# Patient Record
Sex: Male | Born: 1955
Health system: Southern US, Community
[De-identification: ages and names within clinical notes are randomized; demographics above are authoritative.]

## PROBLEM LIST (undated history)

## (undated) DIAGNOSIS — E119 Type 2 diabetes mellitus without complications: Secondary | ICD-10-CM

## (undated) DIAGNOSIS — Z85828 Personal history of other malignant neoplasm of skin: Secondary | ICD-10-CM

## (undated) DIAGNOSIS — I10 Essential (primary) hypertension: Secondary | ICD-10-CM

## (undated) DIAGNOSIS — R351 Nocturia: Secondary | ICD-10-CM

## (undated) DIAGNOSIS — E669 Obesity, unspecified: Secondary | ICD-10-CM

## (undated) DIAGNOSIS — R350 Frequency of micturition: Secondary | ICD-10-CM

## (undated) DIAGNOSIS — M1990 Unspecified osteoarthritis, unspecified site: Secondary | ICD-10-CM

## (undated) DIAGNOSIS — E785 Hyperlipidemia, unspecified: Secondary | ICD-10-CM

## (undated) HISTORY — DX: Hyperlipidemia, unspecified: E78.5

## (undated) HISTORY — DX: Obesity, unspecified: E66.9

## (undated) HISTORY — DX: Type 2 diabetes mellitus without complications: E11.9

## (undated) HISTORY — PX: APPENDECTOMY: SHX54

---

## 2012-07-03 HISTORY — PX: KNEE ARTHROSCOPY: SUR90

## 2013-05-09 ENCOUNTER — Encounter: Payer: 59 | Attending: Family Medicine

## 2013-05-09 ENCOUNTER — Encounter (INDEPENDENT_AMBULATORY_CARE_PROVIDER_SITE_OTHER): Payer: Self-pay

## 2013-05-09 VITALS — Ht 72.0 in | Wt 310.4 lb

## 2013-05-09 DIAGNOSIS — E119 Type 2 diabetes mellitus without complications: Secondary | ICD-10-CM | POA: Insufficient documentation

## 2013-05-09 DIAGNOSIS — Z713 Dietary counseling and surveillance: Secondary | ICD-10-CM | POA: Insufficient documentation

## 2013-05-09 NOTE — Progress Notes (Signed)
Patient was seen on 05/09/13 for the complete diabetes self-management series at the [facility].  Current A1c = 9.0%  Handouts given during class include:  Living Well with Diabetes book  Carb Counting and Meal Planning book  Meal Plan Card  Carbohydrate guide  Meal planning worksheet  Low Sodium Flavoring Tips  The diabetes portion plate  Low Carbohydrate Snack Suggestions  A1c to eAG Conversion Chart  Diabetes Medications  Stress Management  Diabetes Recommended Care Schedule  Diabetes Success Plan  Core Class Satisfaction Survey  The following learning objectives were met by the patient during this course:  Describe diabetes  State some common risk factors for diabetes  Defines the role of glucose and insulin  Identifies type of diabetes and pathophysiology  Describe the relationship between diabetes and cardiovascular risk  State the members of the Healthcare Team  States the rationale for glucose monitoring  State when to test glucose  State their individual Target Range  State the importance of logging glucose readings  Describe how to interpret glucose readings  Identifies A1C target  Explain the correlation between A1c and eAG values  State symptoms and treatment of high blood glucose  State symptoms and treatment of low blood glucose  Explain proper technique for glucose testing  Identifies proper sharps disposal  Describe the role of different macronutrients on glucose  Explain how carbohydrates affect blood glucose  State what foods contain the most carbohydrates  Demonstrate carbohydrate counting  Demonstrate how to read Nutrition Facts food label  Describe effects of various fats on heart health  Describe the importance of good nutrition for health and healthy eating strategies  Describe techniques for managing your shopping, cooking and meal planning  List strategies to follow meal plan  when dining out  Describe the effects of alcohol on glucose and how to use it safely   State the amount of activity recommended for healthy living   Describe activities suitable for individual needs   Identify ways to regularly incorporate activity into daily life   Identify barriers to activity and ways to over come these barriers  Identify diabetes medications being personally used and their primary action for lowering glucose and possible side effects   Describe role of stress on blood glucose and develop strategies to address psychosocial issues   Identify diabetes complications and ways to prevent them  Explain how to manage diabetes during illness   Evaluate success in meeting personal goal   Establish 2-3 goals that they will plan to diligently work on until they return for the  14-monthfollow-up visit  Goals:  Follow Diabetes Meal Plan as instructed  Eat 3 meals and 2 snacks, every 3-5 hrs  Limit carbohydrate intake to 45 grams carbohydrate/meal Limit carbohydrate intake to 15 grams carbohydrate/snack Add lean protein foods to meals/snacks  Monitor glucose levels as instructed by your doctor  Aim for 15-30 mins of physical activity daily as tolerated  Bring food record and glucose log to your next nutrition visit  Your patient has established the following 4 month goals in their individualized success plan: I will count my carb choices at most meals and snacks and record my food intake overall I will increase my activity level at least 5 days a week I will test my BG 3 times a day, 7 days a week  Your patient has identified these potential barriers to change:  None stated  Your patient has identified their diabetes self-care support plan as  family

## 2013-09-07 ENCOUNTER — Encounter: Payer: 59 | Attending: Family Medicine

## 2013-09-07 DIAGNOSIS — Z713 Dietary counseling and surveillance: Secondary | ICD-10-CM | POA: Diagnosis not present

## 2013-09-07 DIAGNOSIS — E118 Type 2 diabetes mellitus with unspecified complications: Secondary | ICD-10-CM | POA: Diagnosis not present

## 2013-09-07 NOTE — Progress Notes (Signed)
Patient was seen on 09/07/2013 for a review of the series of three diabetes self-management courses at the [facility]. The following learning objectives were met by the patient during this class:    Reviewed blood glucose monitoring and interpretation including the recommended target ranges and Hgb A1c.    Reviewed on carb counting, importance of regularly scheduled meals/snacks, and meal planning.    Reviewed the effects of physical activity on glucose levels and long-term glucose control.  Recommended goal of 150 minutes of physical activity/week.   Reviewed patient medications and discussed role of medication on blood glucose and possible side effects.   Discussed strategies to manage stress, psychosocial issues, and other obstacles to diabetes management.   Encouraged moderate weight reduction to improve glucose levels.     Reviewed short-term complications: hyper- and hypo-glycemia.  Discussed causes, symptoms, and treatment options.   Reviewed prevention, detection, and treatment of long-term complications.  Discussed the role of prolonged elevated glucose levels on body systems.  Goals:  Follow Diabetes Meal Plan as instructed  Eat 3 meals and 2 snacks, every 3-5 hrs  Limit carbohydrate intake to 45 grams carbohydrate/meal Limit carbohydrate intake to 15 grams carbohydrate/snack Add lean protein foods to meals/snacks  Monitor glucose levels as instructed by your doctor  Aim for goal of 15-30 mins of physical activity daily as tolerated  Bring food record and glucose log to your next nutrition visit   

## 2013-09-09 ENCOUNTER — Ambulatory Visit (INDEPENDENT_AMBULATORY_CARE_PROVIDER_SITE_OTHER): Payer: Self-pay | Admitting: Family Medicine

## 2013-09-09 DIAGNOSIS — E119 Type 2 diabetes mellitus without complications: Secondary | ICD-10-CM

## 2013-09-10 DIAGNOSIS — E119 Type 2 diabetes mellitus without complications: Secondary | ICD-10-CM | POA: Insufficient documentation

## 2013-09-10 NOTE — Progress Notes (Signed)
Patient presents today for annual pharmacy consult as part of the employer-sponsored [employer] to [employer] program. Current diabetes regimen includes metformin, Humulin U-500, and Victoza. Patient also continues on daily ASA, ACEi, and statin. Most recent MD follow-up was with Dr. Buddy Duty in May. No med changes or major health changes at this time. Patient has a good understanding of diabetes medications and comes prepared with questions today regarding his diabetes care.  Diabetes Assessment: Type of Diabetes: Type 2; Year of diagnosis 2004; Sees Diabetes provider 4 or more times per year; checks blood glucose once daily; checks feet daily; uses glucometer Freestyle Freedom Lite; takes medications as prescribed; takes an aspirin a day; What are the signs and symptoms of hypoglycemia? Shaky Feels in the 70s; target A1c? 7.0 %; What are the signs of hyperglycemia? High blood glucose sleepy; Current Diabetes related medical conditions are High cholesterol; Diabetes Education 05/09/13 completed class at [facility]; Highest CBG 341 Ate chips; Lowest CBG 56; 14 day CBG average 197; 30 day CBG average 201; 7 day CBG average 194; hypoglycemia frequency 2-3 times in last month occurs after exercise or at night. Feels low at 53; MD managing Diabetes Dr.Ehinger, PCP and Dr. Buddy Duty, endo; Other Diabetes History: Patient was diagnosed with type 2 diabetes at age 58 (approx 10 years ago). He has recently moved here from [place] and is making efforts to gain better control of diabetes. Current diabetes med regimen includes metformin 1000 mg twice daily, Humulin U-500 10 unit markings before each meal, and Victoza 1.8 mg daily. Patient reports some mild GI upset with metformin. While in [place], patient was on Metformin 850 mg twice daily, once moving here, Dr. Buddy Duty increased to 1000 mg twice daily; however, he has not yet started the 1000 mg tablets as he is trying to finish 850 mg tabs first. I have reminded  him to take metformin with a full meal. I have also suggested switching to ER if GI upset continues once switching to 1000 mg strength. If we switch to ER pt would need to take two 500 mg ER tabs, he did not seem to want to do this given it would be 4 tablets per day vs two. Pateint has recently had reduction in insulin requirement due to increased physical activity. He is now down to 10 unit markings prior to each meal. This reduction was made by Dr. Buddy Duty after pateint had hypoglycemic episodes daily for 4 days in a row after starting exercise regimen. Patient is tolerating Victoza well and reports it is helping reduce appetite and helps him feel full faster. Patient is testing twice daily on a normal basis and 5-6 times daily if experiencing lows. When patient does have hypoglycemia, the readings have been as low as 50s, and are easily correted with glucose tablets. Paitent denies signs and symptoms of neuropathy, and is up to date on eye exam. He will follow-up with Peter Garter, RN, CCM.  Lifestyle Factors: Diet - Patient is attempting to make healthier choices and monitor portion sizes in an attempt at weight loss. He is now eating more salads and making low calorie choices. He attempts to keep carbs at 4-5 servings per meal or less. We did not go into great detail with diet as this was primarily a pharmacy/med review visit.  Exercise - Paitent has now joined [employer] and is attending three times weekly, and has been for two weeks. He uses the elliptical for 45 min and then does resistance/weight training.  He is also wakling twice weekly.   Assessment: Patient presents today with above goal a1c of 9.0, but is making appropriate changes to bring this down to goal of <7.0. He has a great understanding of medications and is having no major issues at this time. He will follow-up with CCM in September.  Plan: 1) Continue making healthy dietary choices and continue to improve portion control and carb  control 2) Continue to exercise regularly, great job with increases in this 3) Monitor regularly, especially around exericse 4) Follow-up with Peter Garter with [facility] in September

## 2013-10-29 NOTE — Progress Notes (Signed)
Patient ID: Eric Juarez, male   DOB: 1955-09-23, 58 y.o.   MRN: [id number] Reviewed: Agree with the documentation and management by my Green.

## 2014-04-01 ENCOUNTER — Other Ambulatory Visit (INDEPENDENT_AMBULATORY_CARE_PROVIDER_SITE_OTHER): Payer: Self-pay | Admitting: General Surgery

## 2014-04-01 DIAGNOSIS — E8881 Metabolic syndrome: Secondary | ICD-10-CM

## 2014-04-23 ENCOUNTER — Ambulatory Visit (HOSPITAL_COMMUNITY)
Admission: RE | Admit: 2014-04-23 | Discharge: 2014-04-23 | Disposition: A | Discharge status: Home / Self Care | Payer: 59 | Source: Ambulatory Visit | Attending: General Surgery | Admitting: General Surgery

## 2014-04-23 ENCOUNTER — Other Ambulatory Visit: Payer: Self-pay

## 2014-04-23 DIAGNOSIS — Z01818 Encounter for other preprocedural examination: Secondary | ICD-10-CM | POA: Diagnosis not present

## 2014-04-23 DIAGNOSIS — E8881 Metabolic syndrome: Secondary | ICD-10-CM

## 2014-05-03 ENCOUNTER — Encounter: Payer: Self-pay | Admitting: Dietician

## 2014-05-03 ENCOUNTER — Encounter: Payer: 59 | Attending: General Surgery | Admitting: Dietician

## 2014-05-03 DIAGNOSIS — Z713 Dietary counseling and surveillance: Secondary | ICD-10-CM | POA: Diagnosis not present

## 2014-05-03 DIAGNOSIS — Z6841 Body Mass Index (BMI) 40.0 and over, adult: Secondary | ICD-10-CM | POA: Insufficient documentation

## 2014-05-03 NOTE — Patient Instructions (Signed)

## 2014-05-03 NOTE — Progress Notes (Signed)
  Pre-Op Assessment Visit:  Pre-Operative RYGB Surgery  Medical Nutrition Therapy:  Appt start time: 1000   End time:  1100.  Patient was seen on 05/03/2014 for Pre-Operative Nutrition Assessment. Assessment and letter of approval faxed to [facility] Surgery Bariatric Surgery Program coordinator on 05/03/2014.   Preferred Learning Style:   No preference indicated   Learning Readiness:   Ready  Handouts given during visit include:  Pre-Op Goals Bariatric Surgery Protein Shakes   During the appointment today the following Pre-Op Goals were reviewed with the patient: Maintain or lose weight as instructed by your surgeon Make healthy food choices Begin to limit portion sizes Limited concentrated sugars and fried foods Keep fat/sugar in the single digits per serving on   food labels Practice CHEWING your food  (aim for 30 chews per bite or until applesauce consistency) Practice not drinking 15 minutes before, during, and 30 minutes after each meal/snack Avoid all carbonated beverages  Avoid/limit caffeinated beverages  Avoid all sugar-sweetened beverages Consume 3 meals per day; eat every 3-5 hours Make a list of non-food related activities Aim for 64-100 ounces of FLUID daily  Aim for at least 60-80 grams of PROTEIN daily Look for a liquid protein source that contain ?15 g protein and ?5 g carbohydrate  (ex: shakes, drinks, shots)  Patient-Centered Goals: Eric Juarez would like to control his diabetes better (high and low blood sugar). Eric Juarez feels like he will be able follow the pre op goal feels like they are very important.    Demonstrated degree of understanding via:  Teach Back  Teaching Method Utilized:  Visual Auditory Hands on  Barriers to learning/adherence to lifestyle change: none  Patient to call the [facility] to enroll in Pre-Op and Post-Op Nutrition Education when surgery date is scheduled.

## 2014-05-27 ENCOUNTER — Ambulatory Visit: Payer: Self-pay | Admitting: General Surgery

## 2014-05-31 ENCOUNTER — Encounter: Payer: 59 | Attending: General Surgery

## 2014-05-31 DIAGNOSIS — Z6841 Body Mass Index (BMI) 40.0 and over, adult: Secondary | ICD-10-CM | POA: Insufficient documentation

## 2014-05-31 DIAGNOSIS — Z713 Dietary counseling and surveillance: Secondary | ICD-10-CM | POA: Diagnosis not present

## 2014-06-01 ENCOUNTER — Other Ambulatory Visit: Payer: Self-pay | Admitting: General Surgery

## 2014-06-01 NOTE — Progress Notes (Signed)
  Pre-Operative Nutrition Class:  Appt start time: 6378   End time:  1830.  Patient was seen on 05/31/14 for Pre-Operative Bariatric Surgery Education at the [facility].   Surgery date:   Surgery type: RYGB Start weight at [facility]: 308 lbs on 05/03/14 Weight today: 306 lbs  TANITA  BODY COMP RESULTS  05/31/14   BMI (kg/m^2) 41.5   Fat Mass (lbs) 122   Fat Free Mass (lbs) 184   Total Body Water (lbs) 134.5   Samples given per MNT protocol. Patient educated on appropriate usage: Unjury protein powder (strawberry - qty 1) Lot #: [id number] Exp: 04/2015  Premier protein shake (chocolate - qty 1) Lot #: [id number] Exp: 01/2015  PB2 (qty 1) Lot #: [id number] Exp: 10/2014  Bariatric Advantage calcium citrate chew (orange - qty 1) Lot #: [id number] Exp: 07/2014  The following the learning objectives were met by the patient during this course:  Identify Pre-Op Dietary Goals and will begin 2 weeks pre-operatively  Identify appropriate sources of fluids and proteins   State protein recommendations and appropriate sources pre and post-operatively  Identify Post-Operative Dietary Goals and will follow for 2 weeks post-operatively  Identify appropriate multivitamin and calcium sources  Describe the need for physical activity post-operatively and will follow MD recommendations  State when to call healthcare provider regarding medication questions or post-operative complications  Handouts given during class include:  Pre-Op Bariatric Surgery Diet Handout  Protein Shake Handout  Post-Op Bariatric Surgery Nutrition Handout  BELT Program Information Flyer  Support Group Information Flyer  [facility] Bariatric Supplements Price List  Follow-Up Plan: Patient will follow-up at [facility] 2 weeks post operatively for diet advancement per MD.

## 2014-06-02 ENCOUNTER — Other Ambulatory Visit: Payer: Self-pay

## 2014-06-02 ENCOUNTER — Other Ambulatory Visit: Payer: Self-pay | Admitting: General Surgery

## 2014-06-02 ENCOUNTER — Ambulatory Visit: Payer: Self-pay | Admitting: General Surgery

## 2014-06-02 NOTE — Patient Outreach (Signed)
[facility]) Care Management  06/02/2014  Jahn Franchini Jan 25, 1956 [id number]   Member called to reschedule appointment for 06/15/14.  States he is scheduled for bariatric surgery on 06/14/14. Appointment rescheduled for 07/06/14.  Peter Garter RN, [facility] Care Management [phone]

## 2014-06-02 NOTE — Patient Instructions (Addendum)
Eric Juarez  06/02/2014   Your procedure is scheduled on: 06/14/14   Report to [facility] Main  Entrance and follow signs to               [place] at 5:15 AM.   Call this number if you have problems the morning of surgery [phone]   Remember:  Do not eat food or drink liquids :After Midnight.    Take these medicines the morning of surgery with A SIP OF WATER: NONE             STOP ASPIRIN / IBUPROFEN / ALEVE / HERBAL AND VITAMINS 7 DAYS PREOP              FOLLOW BOWEL PREP INSTRUCTIONS FROM OFFICE                               You may not have any metal on your body including hair pins and              piercings  Do not wear jewelry, make-up, lotions, powders or perfumes.             Do not wear nail polish.  Do not shave  48 hours prior to surgery.              Men may shave face and neck.   Do not bring valuables to the hospital. [place].  Contacts, dentures or bridgework may not be worn into surgery.  Leave suitcase in the car. After surgery it may be brought to your room.     Patients discharged the day of surgery will not be allowed to drive home.  Name and phone number of your driver:  Special Instructions: N/A              Please read over the following fact sheets you were given: _____________________________________________________________________                                                     [place]  Before surgery, you can play an important role.  Because skin is not sterile, your skin needs to be as free of germs as possible.  You can reduce the number of germs on your skin by washing with CHG (chlorahexidine gluconate) soap before surgery.  CHG is an antiseptic cleaner which kills germs and bonds with the skin to continue killing germs even after washing. Please DO NOT use if you have an allergy to CHG or antibacterial soaps.  If your skin  becomes reddened/irritated stop using the CHG and inform your nurse when you arrive at Short Stay. Do not shave (including legs and underarms) for at least 48 hours prior to the first CHG shower.  You may shave your face. Please follow these instructions carefully:   1.  Shower with CHG Soap the night before surgery and the  morning of Surgery.   2.  If you choose to wash your hair, wash your hair first as usual with your  normal  Shampoo.  3.  After you shampoo, rinse your hair and body thoroughly to remove the  shampoo.                                         4.  Use CHG as you would any other liquid soap.  You can apply chg directly  to the skin and wash . Gently wash with scrungie or clean wascloth    5.  Apply the CHG Soap to your body ONLY FROM THE NECK DOWN.   Do not use on open                           Wound or open sores. Avoid contact with eyes, ears mouth and genitals (private parts).                        Genitals (private parts) with your normal soap.              6.  Wash thoroughly, paying special attention to the area where your surgery  will be performed.   7.  Thoroughly rinse your body with warm water from the neck down.   8.  DO NOT shower/wash with your normal soap after using and rinsing off  the CHG Soap .                9.  Pat yourself dry with a clean towel.             10.  Wear clean pajamas.             11.  Place clean sheets on your bed the night of your first shower and do not  sleep with pets.  Day of Surgery : Do not apply any lotions/deodorants the morning of surgery.  Please wear clean clothes to the hospital/surgery center.  FAILURE TO FOLLOW THESE INSTRUCTIONS MAY RESULT IN THE CANCELLATION OF YOUR SURGERY    PATIENT SIGNATURE_________________________________  ______________________________________________________________________

## 2014-06-02 NOTE — H&P (Signed)
Eric Juarez. Gsell 06/02/2014 11:09 AM Location: [facility] Patient #: [id number] DOB: [birth year] Married / Language: Eric Juarez / Race: White Male  History of Present Illness Eric Juarez M. Liahm Grivas MD; 06/02/2014 11:36 AM) Patient words: preop.  The patient is a 59 year old male who presents for a pre-op visit. He has been approved for laparoscopic Roux-en-Y gastric bypass surgery with possible hiatal hernia repair and upper endoscopy in the near future. He was initially seen in the office in late January 2016. He denies any changes to his medical history since he was initially seen. He denies any trips the emergency room. He is checking his blood sugars little more regularly because of the low carbohydrate diet he is on for the preoperative diet. He denies any fever, chills, nausea, vomiting, chest pain, chest pressure, shortness of breath.  His preoperative workup was unremarkable. His chest x-ray and EKG were within normal limits. His initial bariatric surgery evaluation labs were unremarkable except for hemoglobin A1c of 8.3 which was improved. His upper GI showed a small hiatal hernia. He denies reflux.  Dr. Buddy Duty continues to manage his blood sugars.   Other Problems Eric Curry, MD; 06/02/2014 11:36 AM) Cancer Hemorrhoids DYSLIPIDEMIA (272.4  E78.5) HTN (HYPERTENSION), BENIGN (401.1  I10) DIABETES MELLITUS, INSULIN DEPENDENT (IDDM), CONTROLLED (250.01  E10.9) OBESITY, MORBID, BMI 40.0-49.9 (278.01  E66.01)  Past Surgical History Eric Curry, MD; 06/02/2014 11:36 AM) Knee Surgery Left. Oral Surgery Appendectomy Vasectomy  Diagnostic Studies History Eric Curry, MD; 06/02/2014 11:36 AM) Colonoscopy never  Allergies ([name], LPN; 07/30/7822 23:53 AM) No Known Drug Allergies01/28/2016  Medication History Eric Curry, MD; 06/02/2014 11:36 AM) [name] (18MG Fayne Mediate Soln Pen-inj, Subcutaneous daily) Active. Enalapril Maleate (5MG  Tablet, Oral  daily) Active. HumuLIN R U-500 (CONCENTRATED) (500UNIT/ML Solution, Subcutaneous as directed) Active. MetFORMIN HCl ER (500MG  Tablet ER 24HR, Oral daily) Active. Simvastatin (20MG  Tablet, Oral daily) Active. OxyCODONE HCl (5MG /5ML Solution, 5-10 Milliliter Oral every four hours, as needed, Taken starting 06/02/2014) Active.  Social History Eric Curry, MD; 06/02/2014 11:36 AM) Caffeine use Carbonated beverages, Coffee, Tea. No drug use Alcohol use Occasional alcohol use. Tobacco use Former smoker.  Family History Eric Curry, MD; 06/02/2014 11:36 AM) Heart Disease Father. Hypertension Father. Cerebrovascular Accident Father. Diabetes Mellitus Father, Sister.  Review of Systems Eric Curry, MD; 06/02/2014 11:32 00) General Present- Weight Gain. Not Present- Appetite Loss, Chills, Fatigue, Fever, Night Sweats and Weight Loss. Skin Present- Ulcer. Not Present- Change in Wart/Mole, Dryness, Hives, Jaundice, New Lesions, Non-Healing Wounds and Rash. HEENT Present- Wears glasses/contact lenses. Not Present- Earache, Hearing Loss, Hoarseness, Nose Bleed, Oral Ulcers, Ringing in the Ears, Seasonal Allergies, Sinus Pain, Sore Throat, Visual Disturbances and Yellow Eyes. Respiratory Not Present- Bloody sputum, Chronic Cough, Difficulty Breathing, Snoring and Wheezing. Breast Not Present- Breast Mass, Breast Pain, Nipple Discharge and Skin Changes. Cardiovascular Not Present- Chest Pain, Difficulty Breathing Lying Down, Leg Cramps, Palpitations, Rapid Heart Rate, Shortness of Breath and Swelling of Extremities. Gastrointestinal Not Present- Abdominal Pain, Bloating, Bloody Stool, Change in Bowel Habits, Chronic diarrhea, Constipation, Difficulty Swallowing, Excessive gas, Gets full quickly at meals, Hemorrhoids, Indigestion, Nausea, Rectal Pain and Vomiting. Male Genitourinary Present- Impotence. Not Present- Blood in Urine, Change in Urinary Stream, Frequency, Nocturia, Painful  Urination, Urgency and Urine Leakage. Musculoskeletal Not Present- Back Pain, Joint Pain, Joint Stiffness, Muscle Pain, Muscle Weakness and Swelling of Extremities. Neurological Not Present- Decreased Memory, Fainting, Headaches, Numbness, Seizures, Tingling, Tremor, Trouble walking and Weakness. Psychiatric Not  Present- Anxiety, Bipolar, Change in Sleep Pattern, Depression, Fearful and Frequent crying. Endocrine Not Present- Cold Intolerance, Excessive Hunger, Hair Changes, Heat Intolerance and New Diabetes. Hematology Not Present- Easy Bruising, Excessive bleeding, Gland problems, HIV and Persistent Infections.   Vitals ([name] LPN; 3/87/5643 32:95 AM) 06/02/2014 11:09 AM Weight: 305.2 lb Height: 73in Body Surface Area: 2.67 m Body Mass Index: 40.27 kg/m Temp.: 98.44F(Oral)  Pulse: 88 (Regular)  Resp.: 16 (Unlabored)  BP: 132/88 (Sitting, Left Arm, Standard)    Physical Exam Eric Juarez M. Ady Heimann MD; 06/02/2014 11:32 AM) General Mental Status-Alert. General Appearance-Consistent with stated age. Hydration-Well hydrated. Voice-Normal. Note: morbidly obese, central truncal obesity;   Head and Neck Head-normocephalic, atraumatic with no lesions or palpable masses. Trachea-midline. Thyroid Gland Characteristics - normal size and consistency.  Eye Eyeball - Bilateral-Extraocular movements intact. Sclera/Conjunctiva - Bilateral-No scleral icterus.  Chest and Lung Exam Chest and lung exam reveals -quiet, even and easy respiratory effort with no use of accessory muscles and on auscultation, normal breath sounds, no adventitious sounds and normal vocal resonance. Inspection Chest Wall - Normal. Back - normal.  Breast - Did not examine.  Cardiovascular Cardiovascular examination reveals -normal heart sounds, regular rate and rhythm with no murmurs and normal pedal pulses bilaterally.  Abdomen Inspection Inspection of the abdomen reveals -  No Hernias. Skin - Scar - no surgical scars. Palpation/Percussion Palpation and Percussion of the abdomen reveal - Soft, Non Tender, No Rebound tenderness, No Rigidity (guarding) and No hepatosplenomegaly. Auscultation Auscultation of the abdomen reveals - Bowel sounds normal.  Peripheral Vascular Upper Extremity Palpation - Pulses bilaterally normal.  Neurologic Neurologic evaluation reveals -alert and oriented x 3 with no impairment of recent or remote memory. Mental Status-Normal.  Neuropsychiatric The patient's mood and affect are described as -normal. Judgment and Insight-insight is appropriate concerning matters relevant to self.  Musculoskeletal Normal Exam - Left-Upper Extremity Strength Normal and Lower Extremity Strength Normal. Normal Exam - Right-Upper Extremity Strength Normal and Lower Extremity Strength Normal.  Lymphatic Head & Neck  General Head & Neck Lymphatics: Bilateral - Description - Normal. Axillary - Did not examine. Femoral & Inguinal - Did not examine.    Assessment & Plan Eric Juarez M. Khamari Yousuf MD; 06/02/2014 11:36 AM) OBESITY, MORBID, BMI 40.0-49.9 (278.01  E66.01) Impression: We reviewed his workup today. We discussed his laboratory results. We rediscussed the typical postoperative course. He was given his postoperative pain medicine prescription today. We explained that after surgery on discharge he will probably go home on sliding scale insulin and he'll need to keep a log of his blood sugars. I explained that he will need to be in contact more frequently with his endocrinologist to help manage his his insulin requirement. He is also given his instructions on his bowel prep for the day before surgery. All of his questions were asked and answered. Current Plans  Instructions: Congratulations on starting your journey to a healthier life! Over the next few weeks you will be undergoing tests (x-rays and labs) and seeing specialists to help  evaluate you for weight loss surgery. These tests and consultations with a psychologist and nutritionist are needed to prepare you for the lifestyle changes that lie ahead and are often required by insurance companies to approve you for surgery. Please call me if you have any questions during the evaluation.  Pathway to Surgery:  Two weeks prior to surgery Go on the extremely low carb liquid diet - this will decrease the size of your liver which will make  surgery safer - the nutritionist will go over this at a later date Attend preoperative appointment with your surgeon Attend preoperative surgery class  One week prior to surgery No aspirin products. Tylenol is acceptable  24 hours prior to surgery No alcoholic beverages Report fever greater than 100.5 or excessive nasal drainage suggesting infection Continue bariatric preop diet Perform bowel prep if ordered Do not eat or drink anything after midnight the night before surgery Do not take any medications except those instructed by the anesthesiologist  Morning of surgery Please arrive at the hospital at least 2 hours before your scheduled surgery time. No makeup, fingernail polish or jewelry Bring insurance cards with you Bring your CPAP mask if you use this Started OxyCODONE HCl 5MG /5ML, 5-10 Milliliter every four hours, as needed, 200 Milliliter, 06/02/2014, No Refill. DIABETES MELLITUS, INSULIN DEPENDENT (IDDM), CONTROLLED (250.01  E10.9) Impression: Managed per her endocrinologist HTN (HYPERTENSION), BENIGN (401.1  I10) Impression: Managed per PCP DYSLIPIDEMIA (272.4  E78.5) Impression: Managed per PCP  Leighton Ruff. Redmond Pulling, MD, FACS General, Bariatric, & Minimally Invasive Surgery [facility], [place]

## 2014-06-03 ENCOUNTER — Encounter (HOSPITAL_COMMUNITY): Payer: Self-pay

## 2014-06-03 ENCOUNTER — Encounter (HOSPITAL_COMMUNITY)
Admission: RE | Admit: 2014-06-03 | Discharge: 2014-06-03 | Disposition: A | Discharge status: Home / Self Care | Payer: 59 | Source: Ambulatory Visit | Attending: General Surgery | Admitting: General Surgery

## 2014-06-03 DIAGNOSIS — Z01812 Encounter for preprocedural laboratory examination: Secondary | ICD-10-CM | POA: Insufficient documentation

## 2014-06-03 DIAGNOSIS — Z6841 Body Mass Index (BMI) 40.0 and over, adult: Secondary | ICD-10-CM | POA: Diagnosis not present

## 2014-06-03 HISTORY — DX: Essential (primary) hypertension: I10

## 2014-06-03 HISTORY — DX: Unspecified osteoarthritis, unspecified site: M19.90

## 2014-06-03 HISTORY — DX: Personal history of other malignant neoplasm of skin: Z85.828

## 2014-06-03 HISTORY — DX: Frequency of micturition: R35.0

## 2014-06-03 HISTORY — DX: Nocturia: R35.1

## 2014-06-03 LAB — CBC WITH DIFFERENTIAL/PLATELET
BASOS ABS: 0.1 10*3/uL (ref 0.0–0.1)
Basophils Relative: 1 % (ref 0–1)
Eosinophils Absolute: 0.2 10*3/uL (ref 0.0–0.7)
Eosinophils Relative: 2 % (ref 0–5)
HEMATOCRIT: 44 % (ref 39.0–52.0)
Hemoglobin: 14.9 g/dL (ref 13.0–17.0)
LYMPHS PCT: 19 % (ref 12–46)
Lymphs Abs: 2 10*3/uL (ref 0.7–4.0)
MCH: 28.9 pg (ref 26.0–34.0)
MCHC: 33.9 g/dL (ref 30.0–36.0)
MCV: 85.4 fL (ref 78.0–100.0)
MONOS PCT: 9 % (ref 3–12)
Monocytes Absolute: 0.9 10*3/uL (ref 0.1–1.0)
NEUTROS ABS: 7.2 10*3/uL (ref 1.7–7.7)
Neutrophils Relative %: 69 % (ref 43–77)
Platelets: 199 10*3/uL (ref 150–400)
RBC: 5.15 MIL/uL (ref 4.22–5.81)
RDW: 13.4 % (ref 11.5–15.5)
WBC: 10.4 10*3/uL (ref 4.0–10.5)

## 2014-06-03 LAB — COMPREHENSIVE METABOLIC PANEL
ALT: 36 U/L (ref 0–53)
ANION GAP: 8 (ref 5–15)
AST: 34 U/L (ref 0–37)
Albumin: 4.3 g/dL (ref 3.5–5.2)
Alkaline Phosphatase: 100 U/L (ref 39–117)
BUN: 20 mg/dL (ref 6–23)
CHLORIDE: 100 mmol/L (ref 96–112)
CO2: 28 mmol/L (ref 19–32)
Calcium: 9.3 mg/dL (ref 8.4–10.5)
Creatinine, Ser: 0.76 mg/dL (ref 0.50–1.35)
GFR calc Af Amer: >90 mL/min (ref >90)
Glucose, Bld: 224 mg/dL — ABNORMAL HIGH (ref 70–99)
Potassium: 4.1 mmol/L (ref 3.5–5.1)
SODIUM: 136 mmol/L (ref 135–145)
Total Bilirubin: 1 mg/dL (ref 0.3–1.2)
Total Protein: 7.2 g/dL (ref 6.0–8.3)

## 2014-06-03 NOTE — Progress Notes (Signed)
   06/03/14 0938  OBSTRUCTIVE SLEEP APNEA  Have you ever been diagnosed with sleep apnea through a sleep study? No  Do you snore loudly (loud enough to be heard through closed doors)?  0  Do you often feel tired, fatigued, or sleepy during the daytime? 0  Has anyone observed you stop breathing during your sleep? 0  Do you have, or are you being treated for high blood pressure? 0  BMI more than 35 kg/m2? 1  Age over 61 years old? 1  Neck circumference greater than 40 cm/16 inches? 1  Gender: 1

## 2014-06-14 ENCOUNTER — Encounter (HOSPITAL_COMMUNITY): Payer: Self-pay | Admitting: *Deleted

## 2014-06-14 ENCOUNTER — Inpatient Hospital Stay (HOSPITAL_COMMUNITY): Payer: 59 | Admitting: Certified Registered Nurse Anesthetist

## 2014-06-14 ENCOUNTER — Inpatient Hospital Stay (HOSPITAL_COMMUNITY)
Admission: RE | Admit: 2014-06-14 | Discharge: 2014-06-16 | DRG: 621 | Disposition: A | Discharge status: Home / Self Care | Payer: 59 | Source: Ambulatory Visit | Attending: General Surgery | Admitting: General Surgery

## 2014-06-14 ENCOUNTER — Encounter (HOSPITAL_COMMUNITY)
Admission: RE | Disposition: A | Discharge status: Home / Self Care | Payer: Self-pay | Source: Ambulatory Visit | Attending: General Surgery

## 2014-06-14 DIAGNOSIS — Z823 Family history of stroke: Secondary | ICD-10-CM | POA: Diagnosis not present

## 2014-06-14 DIAGNOSIS — Z6841 Body Mass Index (BMI) 40.0 and over, adult: Secondary | ICD-10-CM | POA: Diagnosis not present

## 2014-06-14 DIAGNOSIS — K449 Diaphragmatic hernia without obstruction or gangrene: Secondary | ICD-10-CM | POA: Diagnosis present

## 2014-06-14 DIAGNOSIS — Z87891 Personal history of nicotine dependence: Secondary | ICD-10-CM

## 2014-06-14 DIAGNOSIS — Z79899 Other long term (current) drug therapy: Secondary | ICD-10-CM

## 2014-06-14 DIAGNOSIS — K649 Unspecified hemorrhoids: Secondary | ICD-10-CM | POA: Diagnosis present

## 2014-06-14 DIAGNOSIS — Z794 Long term (current) use of insulin: Secondary | ICD-10-CM | POA: Diagnosis not present

## 2014-06-14 DIAGNOSIS — Z9884 Bariatric surgery status: Secondary | ICD-10-CM

## 2014-06-14 DIAGNOSIS — I10 Essential (primary) hypertension: Secondary | ICD-10-CM | POA: Diagnosis present

## 2014-06-14 DIAGNOSIS — Z833 Family history of diabetes mellitus: Secondary | ICD-10-CM | POA: Diagnosis not present

## 2014-06-14 DIAGNOSIS — E119 Type 2 diabetes mellitus without complications: Secondary | ICD-10-CM

## 2014-06-14 DIAGNOSIS — Z01812 Encounter for preprocedural laboratory examination: Secondary | ICD-10-CM

## 2014-06-14 DIAGNOSIS — E785 Hyperlipidemia, unspecified: Secondary | ICD-10-CM | POA: Diagnosis present

## 2014-06-14 DIAGNOSIS — E1165 Type 2 diabetes mellitus with hyperglycemia: Secondary | ICD-10-CM | POA: Diagnosis present

## 2014-06-14 DIAGNOSIS — Z8249 Family history of ischemic heart disease and other diseases of the circulatory system: Secondary | ICD-10-CM | POA: Diagnosis not present

## 2014-06-14 DIAGNOSIS — R110 Nausea: Secondary | ICD-10-CM

## 2014-06-14 DIAGNOSIS — Z79891 Long term (current) use of opiate analgesic: Secondary | ICD-10-CM | POA: Diagnosis not present

## 2014-06-14 HISTORY — PX: GASTRIC ROUX-EN-Y: SHX5262

## 2014-06-14 LAB — HEMOGLOBIN AND HEMATOCRIT, BLOOD
HCT: 42.6 % (ref 39.0–52.0)
Hemoglobin: 14.5 g/dL (ref 13.0–17.0)

## 2014-06-14 LAB — GLUCOSE, CAPILLARY
GLUCOSE-CAPILLARY: 204 mg/dL — AB (ref 70–99)
GLUCOSE-CAPILLARY: 316 mg/dL — AB (ref 70–99)
GLUCOSE-CAPILLARY: 263 mg/dL — AB (ref 70–99)
Glucose-Capillary: 249 mg/dL — ABNORMAL HIGH (ref 70–99)
Glucose-Capillary: 353 mg/dL — ABNORMAL HIGH (ref 70–99)
Glucose-Capillary: 327 mg/dL — ABNORMAL HIGH (ref 70–99)
Glucose-Capillary: 317 mg/dL — ABNORMAL HIGH (ref 70–99)
Glucose-Capillary: 244 mg/dL — ABNORMAL HIGH (ref 70–99)

## 2014-06-14 SURGERY — LAPAROSCOPIC ROUX-EN-Y GASTRIC BYPASS WITH UPPER ENDOSCOPY
Anesthesia: General

## 2014-06-14 MED ORDER — ROCURONIUM BROMIDE 100 MG/10ML IV SOLN
INTRAVENOUS | Status: AC
  Filled 2014-06-14: qty 1

## 2014-06-14 MED ORDER — POTASSIUM CHLORIDE IN NACL 20-0.45 MEQ/L-% IV SOLN
INTRAVENOUS | Status: DC
  Administered 2014-06-14 – 2014-06-15 (×3): via INTRAVENOUS
  Administered 2014-06-15: 1000 mL via INTRAVENOUS
  Administered 2014-06-15: 05:00:00 via INTRAVENOUS
  Administered 2014-06-16: 125 mL/h via INTRAVENOUS
  Filled 2014-06-14 (×7): qty 1000

## 2014-06-14 MED ORDER — OXYCODONE HCL 5 MG/5ML PO SOLN
5.0000 mg | ORAL | Status: DC | PRN
  Administered 2014-06-15: 5 mg via ORAL
  Administered 2014-06-15: 10 mg via ORAL
  Filled 2014-06-14: qty 5
  Filled 2014-06-14: qty 10

## 2014-06-14 MED ORDER — INSULIN ASPART 100 UNIT/ML ~~LOC~~ SOLN
SUBCUTANEOUS | Status: AC
  Filled 2014-06-14: qty 1

## 2014-06-14 MED ORDER — FENTANYL CITRATE 0.05 MG/ML IJ SOLN
INTRAMUSCULAR | Status: DC | PRN
  Administered 2014-06-14 (×3): 50 ug via INTRAVENOUS
  Administered 2014-06-14: 100 ug via INTRAVENOUS
  Administered 2014-06-14 (×4): 50 ug via INTRAVENOUS

## 2014-06-14 MED ORDER — INSULIN ASPART 100 UNIT/ML ~~LOC~~ SOLN
0.0000 [IU] | SUBCUTANEOUS | Status: DC
  Administered 2014-06-14: 7 [IU] via SUBCUTANEOUS
  Administered 2014-06-14: 11 [IU] via SUBCUTANEOUS
  Administered 2014-06-15: 4 [IU] via SUBCUTANEOUS
  Administered 2014-06-15: 7 [IU] via SUBCUTANEOUS
  Administered 2014-06-15: 3 [IU] via SUBCUTANEOUS
  Administered 2014-06-15 (×2): 4 [IU] via SUBCUTANEOUS
  Administered 2014-06-15 – 2014-06-16 (×3): 7 [IU] via SUBCUTANEOUS
  Administered 2014-06-16: 4 [IU] via SUBCUTANEOUS

## 2014-06-14 MED ORDER — INSULIN ASPART 100 UNIT/ML ~~LOC~~ SOLN
15.0000 [IU] | Freq: Once | SUBCUTANEOUS | Status: AC
  Administered 2014-06-14: 15 [IU] via SUBCUTANEOUS

## 2014-06-14 MED ORDER — PROMETHAZINE HCL 25 MG/ML IJ SOLN: 12.5000 mg | Freq: Four times a day (QID) | INTRAMUSCULAR | Status: DC | PRN

## 2014-06-14 MED ORDER — ENOXAPARIN SODIUM 30 MG/0.3ML ~~LOC~~ SOLN
30.0000 mg | Freq: Two times a day (BID) | SUBCUTANEOUS | Status: DC
  Administered 2014-06-15 – 2014-06-16 (×3): 30 mg via SUBCUTANEOUS
  Filled 2014-06-14 (×5): qty 0.3

## 2014-06-14 MED ORDER — HEPARIN SODIUM (PORCINE) 5000 UNIT/ML IJ SOLN
5000.0000 [IU] | INTRAMUSCULAR | Status: AC
  Administered 2014-06-14: 5000 [IU] via SUBCUTANEOUS
  Filled 2014-06-14: qty 1

## 2014-06-14 MED ORDER — GLYCOPYRROLATE 0.2 MG/ML IJ SOLN
INTRAMUSCULAR | Status: DC | PRN
  Administered 2014-06-14: .8 mg via INTRAVENOUS

## 2014-06-14 MED ORDER — GLYCOPYRROLATE 0.2 MG/ML IJ SOLN
INTRAMUSCULAR | Status: AC
  Filled 2014-06-14: qty 3

## 2014-06-14 MED ORDER — FENTANYL CITRATE 0.05 MG/ML IJ SOLN
INTRAMUSCULAR | Status: AC
  Filled 2014-06-14: qty 2

## 2014-06-14 MED ORDER — PROMETHAZINE HCL 25 MG/ML IJ SOLN: 6.2500 mg | INTRAMUSCULAR | Status: DC | PRN

## 2014-06-14 MED ORDER — STERILE WATER FOR IRRIGATION IR SOLN
Status: DC | PRN
  Administered 2014-06-14: 1500 mL

## 2014-06-14 MED ORDER — SODIUM CHLORIDE 0.9 % IJ SOLN
INTRAMUSCULAR | Status: AC
  Filled 2014-06-14: qty 50

## 2014-06-14 MED ORDER — PROPOFOL 10 MG/ML IV BOLUS
INTRAVENOUS | Status: AC
  Filled 2014-06-14: qty 20

## 2014-06-14 MED ORDER — EVICEL 5 ML EX KIT
PACK | CUTANEOUS | Status: DC | PRN
  Administered 2014-06-14 (×2): 1

## 2014-06-14 MED ORDER — FENTANYL CITRATE 0.05 MG/ML IJ SOLN
INTRAMUSCULAR | Status: AC
  Filled 2014-06-14: qty 5

## 2014-06-14 MED ORDER — DEXAMETHASONE SODIUM PHOSPHATE 10 MG/ML IJ SOLN
INTRAMUSCULAR | Status: AC
  Filled 2014-06-14: qty 1

## 2014-06-14 MED ORDER — LACTATED RINGERS IV SOLN: INTRAVENOUS | Status: DC

## 2014-06-14 MED ORDER — SUCCINYLCHOLINE CHLORIDE 20 MG/ML IJ SOLN
INTRAMUSCULAR | Status: DC | PRN
  Administered 2014-06-14: 140 mg via INTRAVENOUS

## 2014-06-14 MED ORDER — MIDAZOLAM HCL 2 MG/2ML IJ SOLN
INTRAMUSCULAR | Status: AC
  Filled 2014-06-14: qty 2

## 2014-06-14 MED ORDER — FENTANYL CITRATE 0.05 MG/ML IJ SOLN
25.0000 ug | INTRAMUSCULAR | Status: DC | PRN
  Administered 2014-06-14: 50 ug via INTRAVENOUS
  Administered 2014-06-14 (×2): 25 ug via INTRAVENOUS
  Administered 2014-06-14: 50 ug via INTRAVENOUS

## 2014-06-14 MED ORDER — BUPIVACAINE LIPOSOME 1.3 % IJ SUSP
20.0000 mL | Freq: Once | INTRAMUSCULAR | Status: AC
  Administered 2014-06-14: 20 mL
  Filled 2014-06-14: qty 20

## 2014-06-14 MED ORDER — 0.9 % SODIUM CHLORIDE (POUR BTL) OPTIME
TOPICAL | Status: DC | PRN
  Administered 2014-06-14: 1000 mL

## 2014-06-14 MED ORDER — CHLORHEXIDINE GLUCONATE 0.12 % MT SOLN
15.0000 mL | Freq: Two times a day (BID) | OROMUCOSAL | Status: DC
  Administered 2014-06-14 – 2014-06-15 (×2): 15 mL via OROMUCOSAL
  Filled 2014-06-14 (×5): qty 15

## 2014-06-14 MED ORDER — ONDANSETRON HCL 4 MG/2ML IJ SOLN
INTRAMUSCULAR | Status: DC | PRN
  Administered 2014-06-14: 4 mg via INTRAVENOUS

## 2014-06-14 MED ORDER — SODIUM CHLORIDE 0.9 % IJ SOLN
INTRAMUSCULAR | Status: AC
  Filled 2014-06-14: qty 10

## 2014-06-14 MED ORDER — UNJURY VANILLA POWDER
2.0000 [oz_av] | Freq: Four times a day (QID) | ORAL | Status: DC
  Administered 2014-06-16: 2 [oz_av] via ORAL

## 2014-06-14 MED ORDER — DEXAMETHASONE SODIUM PHOSPHATE 10 MG/ML IJ SOLN
INTRAMUSCULAR | Status: DC | PRN
  Administered 2014-06-14: 10 mg via INTRAVENOUS

## 2014-06-14 MED ORDER — GLYCOPYRROLATE 0.2 MG/ML IJ SOLN
INTRAMUSCULAR | Status: AC
  Filled 2014-06-14: qty 1

## 2014-06-14 MED ORDER — EPHEDRINE SULFATE 50 MG/ML IJ SOLN
INTRAMUSCULAR | Status: AC
  Filled 2014-06-14: qty 1

## 2014-06-14 MED ORDER — INSULIN ASPART 100 UNIT/ML ~~LOC~~ SOLN
SUBCUTANEOUS | Status: DC | PRN
  Administered 2014-06-14: 5 [IU] via SUBCUTANEOUS
  Administered 2014-06-14: 8 [IU] via SUBCUTANEOUS

## 2014-06-14 MED ORDER — MEPERIDINE HCL 50 MG/ML IJ SOLN: 6.2500 mg | INTRAMUSCULAR | Status: DC | PRN

## 2014-06-14 MED ORDER — PANTOPRAZOLE SODIUM 40 MG IV SOLR
40.0000 mg | Freq: Every day | INTRAVENOUS | Status: DC
  Administered 2014-06-14 – 2014-06-15 (×2): 40 mg via INTRAVENOUS
  Filled 2014-06-14 (×3): qty 40

## 2014-06-14 MED ORDER — TISSEEL VH 10 ML EX KIT
PACK | CUTANEOUS | Status: AC
  Filled 2014-06-14: qty 1

## 2014-06-14 MED ORDER — MIDAZOLAM HCL 5 MG/5ML IJ SOLN
INTRAMUSCULAR | Status: DC | PRN
  Administered 2014-06-14: 2 mg via INTRAVENOUS

## 2014-06-14 MED ORDER — UNJURY CHICKEN SOUP POWDER: 2.0000 [oz_av] | Freq: Four times a day (QID) | ORAL | Status: DC

## 2014-06-14 MED ORDER — ONDANSETRON HCL 4 MG/2ML IJ SOLN
INTRAMUSCULAR | Status: AC
  Filled 2014-06-14: qty 2

## 2014-06-14 MED ORDER — SODIUM CHLORIDE 0.9 % IJ SOLN
INTRAMUSCULAR | Status: DC | PRN
  Administered 2014-06-14: 50 mL

## 2014-06-14 MED ORDER — CHLORHEXIDINE GLUCONATE 4 % EX LIQD: 60.0000 mL | Freq: Once | CUTANEOUS | Status: DC

## 2014-06-14 MED ORDER — LIDOCAINE HCL (CARDIAC) 20 MG/ML IV SOLN
INTRAVENOUS | Status: AC
  Filled 2014-06-14: qty 5

## 2014-06-14 MED ORDER — PHENYLEPHRINE 40 MCG/ML (10ML) SYRINGE FOR IV PUSH (FOR BLOOD PRESSURE SUPPORT)
PREFILLED_SYRINGE | INTRAVENOUS | Status: AC
  Filled 2014-06-14: qty 10

## 2014-06-14 MED ORDER — ACETAMINOPHEN 160 MG/5ML PO SOLN: 325.0000 mg | ORAL | Status: DC | PRN

## 2014-06-14 MED ORDER — ROCURONIUM BROMIDE 100 MG/10ML IV SOLN
INTRAVENOUS | Status: DC | PRN
  Administered 2014-06-14: 20 mg via INTRAVENOUS
  Administered 2014-06-14 (×2): 10 mg via INTRAVENOUS
  Administered 2014-06-14: 20 mg via INTRAVENOUS
  Administered 2014-06-14: 50 mg via INTRAVENOUS
  Administered 2014-06-14: 20 mg via INTRAVENOUS
  Administered 2014-06-14: 10 mg via INTRAVENOUS

## 2014-06-14 MED ORDER — ACETAMINOPHEN 10 MG/ML IV SOLN
1000.0000 mg | Freq: Once | INTRAVENOUS | Status: AC
  Administered 2014-06-14: 1000 mg via INTRAVENOUS
  Filled 2014-06-14: qty 100

## 2014-06-14 MED ORDER — INSULIN ASPART 100 UNIT/ML ~~LOC~~ SOLN
10.0000 [IU] | Freq: Once | SUBCUTANEOUS | Status: AC
  Administered 2014-06-14: 10 [IU] via SUBCUTANEOUS

## 2014-06-14 MED ORDER — CEFOTETAN DISODIUM-DEXTROSE 2-2.08 GM-% IV SOLR
2.0000 g | INTRAVENOUS | Status: AC
  Administered 2014-06-14: 2 g via INTRAVENOUS

## 2014-06-14 MED ORDER — MORPHINE SULFATE 2 MG/ML IJ SOLN
2.0000 mg | INTRAMUSCULAR | Status: DC | PRN
  Administered 2014-06-14 – 2014-06-15 (×5): 2 mg via INTRAVENOUS
  Filled 2014-06-14 (×6): qty 1

## 2014-06-14 MED ORDER — NEOSTIGMINE METHYLSULFATE 10 MG/10ML IV SOLN
INTRAVENOUS | Status: DC | PRN
  Administered 2014-06-14: 5 mg via INTRAVENOUS

## 2014-06-14 MED ORDER — LACTATED RINGERS IV SOLN
INTRAVENOUS | Status: DC | PRN
  Administered 2014-06-14 (×4): via INTRAVENOUS

## 2014-06-14 MED ORDER — PHENYLEPHRINE HCL 10 MG/ML IJ SOLN
INTRAMUSCULAR | Status: DC | PRN
  Administered 2014-06-14: 80 ug via INTRAVENOUS
  Administered 2014-06-14: 40 ug via INTRAVENOUS
  Administered 2014-06-14: 80 ug via INTRAVENOUS
  Administered 2014-06-14: 40 ug via INTRAVENOUS
  Administered 2014-06-14: 80 ug via INTRAVENOUS

## 2014-06-14 MED ORDER — ONDANSETRON HCL 4 MG/2ML IJ SOLN: 4.0000 mg | INTRAMUSCULAR | Status: DC | PRN

## 2014-06-14 MED ORDER — UNJURY CHOCOLATE CLASSIC POWDER
2.0000 [oz_av] | Freq: Four times a day (QID) | ORAL | Status: DC
Start: 2014-06-16 — End: 2014-06-16

## 2014-06-14 MED ORDER — ACETAMINOPHEN 160 MG/5ML PO SOLN: 650.0000 mg | ORAL | Status: DC | PRN

## 2014-06-14 MED ORDER — CETYLPYRIDINIUM CHLORIDE 0.05 % MT LIQD
7.0000 mL | Freq: Two times a day (BID) | OROMUCOSAL | Status: DC
  Administered 2014-06-15 (×2): 7 mL via OROMUCOSAL

## 2014-06-14 MED ORDER — LIDOCAINE HCL (CARDIAC) 20 MG/ML IV SOLN
INTRAVENOUS | Status: DC | PRN
  Administered 2014-06-14: 100 mg via INTRAVENOUS

## 2014-06-14 MED ORDER — PROPOFOL 10 MG/ML IV BOLUS
INTRAVENOUS | Status: DC | PRN
  Administered 2014-06-14: 200 mg via INTRAVENOUS

## 2014-06-14 MED ORDER — CEFOTETAN DISODIUM-DEXTROSE 2-2.08 GM-% IV SOLR
INTRAVENOUS | Status: AC
  Filled 2014-06-14: qty 50

## 2014-06-14 MED ORDER — NEOSTIGMINE METHYLSULFATE 10 MG/10ML IV SOLN
INTRAVENOUS | Status: AC
  Filled 2014-06-14: qty 1

## 2014-06-14 MED ORDER — LACTATED RINGERS IR SOLN
Status: DC | PRN
  Administered 2014-06-14: 1000 mL

## 2014-06-14 SURGICAL SUPPLY — 59 items
APPLICATOR COTTON TIP 6IN STRL (MISCELLANEOUS) IMPLANT
BLADE SURG SZ11 CARB STEEL (BLADE) ×3 IMPLANT
CABLE HIGH FREQUENCY MONO STRZ (ELECTRODE) ×3 IMPLANT
CHLORAPREP W/TINT 26ML (MISCELLANEOUS) ×6 IMPLANT
CLIP SUT LAPRA TY ABSORB (SUTURE) ×9 IMPLANT
CUTTER FLEX LINEAR 45M (STAPLE) IMPLANT
DEVICE SUTURE ENDOST 10MM (ENDOMECHANICALS) ×3 IMPLANT
DRAIN PENROSE 18X1/4 LTX STRL (WOUND CARE) ×3 IMPLANT
DRAPE CAMERA CLOSED 9X96 (DRAPES) IMPLANT
ELECT REM PT RETURN 9FT ADLT (ELECTROSURGICAL) ×3
ELECTRODE REM PT RTRN 9FT ADLT (ELECTROSURGICAL) ×1 IMPLANT
GAUZE SPONGE 4X4 12PLY STRL (GAUZE/BANDAGES/DRESSINGS) IMPLANT
GAUZE SPONGE 4X4 16PLY XRAY LF (GAUZE/BANDAGES/DRESSINGS) ×3 IMPLANT
GLOVE BIOGEL M STRL SZ7.5 (GLOVE) ×3 IMPLANT
GOWN STRL REUS W/TWL XL LVL3 (GOWN DISPOSABLE) ×12 IMPLANT
HOVERMATT SINGLE USE (MISCELLANEOUS) ×3 IMPLANT
KIT BASIN OR (CUSTOM PROCEDURE TRAY) ×3 IMPLANT
KIT GASTRIC LAVAGE 34FR ADT (SET/KITS/TRAYS/PACK) ×3 IMPLANT
LIQUID BAND (GAUZE/BANDAGES/DRESSINGS) ×3 IMPLANT
NEEDLE SPNL 18GX3.5 QUINCKE PK (NEEDLE) ×3 IMPLANT
NEEDLE SPNL 22GX3.5 QUINCKE BK (NEEDLE) ×3 IMPLANT
PACK CARDIOVASCULAR III (CUSTOM PROCEDURE TRAY) ×3 IMPLANT
PEN SKIN MARKING BROAD (MISCELLANEOUS) ×3 IMPLANT
RELOAD 45 VASCULAR/THIN (ENDOMECHANICALS) IMPLANT
RELOAD ENDO STITCH 2.0 (ENDOMECHANICALS) ×22
RELOAD STAPLE TA45 3.5 REG BLU (ENDOMECHANICALS) IMPLANT
RELOAD STAPLER BLUE 60MM (STAPLE) ×5 IMPLANT
RELOAD STAPLER GOLD 60MM (STAPLE) ×1 IMPLANT
RELOAD STAPLER WHITE 60MM (STAPLE) ×2 IMPLANT
SCISSORS LAP 5X45 EPIX DISP (ENDOMECHANICALS) ×3 IMPLANT
SEALANT SURGICAL APPL DUAL CAN (MISCELLANEOUS) ×6 IMPLANT
SET IRRIG TUBING LAPAROSCOPIC (IRRIGATION / IRRIGATOR) ×3 IMPLANT
SHEARS HARMONIC ACE PLUS 45CM (MISCELLANEOUS) ×3 IMPLANT
SLEEVE ADV FIXATION 12X100MM (TROCAR) ×6 IMPLANT
SLEEVE XCEL OPT CAN 5 100 (ENDOMECHANICALS) IMPLANT
SOLUTION ANTI FOG 6CC (MISCELLANEOUS) ×3 IMPLANT
STAPLER ECHELON BIOABSB 60 FLE (MISCELLANEOUS) ×6 IMPLANT
STAPLER ECHELON LONG 60 440 (INSTRUMENTS) ×3 IMPLANT
STAPLER RELOAD BLUE 60MM (STAPLE) ×15
STAPLER RELOAD GOLD 60MM (STAPLE) ×3
STAPLER RELOAD WHITE 60MM (STAPLE) ×6
STAPLER VISISTAT 35W (STAPLE) IMPLANT
SUT MNCRL AB 4-0 PS2 18 (SUTURE) ×6 IMPLANT
SUT RELOAD ENDO STITCH 2 48X1 (ENDOMECHANICALS) ×6
SUT RELOAD ENDO STITCH 2.0 (ENDOMECHANICALS) ×5
SUT VIC AB 2-0 SH 27 (SUTURE) ×2
SUT VIC AB 2-0 SH 27X BRD (SUTURE) ×1 IMPLANT
SUTURE RELOAD END STTCH 2 48X1 (ENDOMECHANICALS) ×6 IMPLANT
SUTURE RELOAD ENDO STITCH 2.0 (ENDOMECHANICALS) ×5 IMPLANT
SYR 20CC LL (SYRINGE) ×6 IMPLANT
TOWEL OR 17X26 10 PK STRL BLUE (TOWEL DISPOSABLE) ×3 IMPLANT
TOWEL OR NON WOVEN STRL DISP B (DISPOSABLE) ×3 IMPLANT
TRAY FOLEY CATH 14FRSI W/METER (CATHETERS) IMPLANT
TROCAR ADV FIXATION 12X100MM (TROCAR) ×3 IMPLANT
TROCAR ADV FIXATION 5X100MM (TROCAR) ×3 IMPLANT
TROCAR BLADELESS OPT 5 100 (ENDOMECHANICALS) ×3 IMPLANT
TROCAR XCEL 12X100 BLDLESS (ENDOMECHANICALS) ×3 IMPLANT
TUBING ENDO SMARTCAP PENTAX (MISCELLANEOUS) ×3 IMPLANT
TUBING FILTER THERMOFLATOR (ELECTROSURGICAL) ×3 IMPLANT

## 2014-06-14 NOTE — H&P (View-Only) (Signed)
Eric Juarez. Sinquefield 06/02/2014 11:09 AM Location: [facility] Patient #: [id number] DOB: [birth year] Married / Language: Eric Juarez / Race: White Male  History of Present Illness Randall Hiss M. Youlanda Tomassetti MD; 06/02/2014 11:36 AM) Patient words: preop.  The patient is a 59 year old male who presents for a pre-op visit. He has been approved for laparoscopic Roux-en-Y gastric bypass surgery with possible hiatal hernia repair and upper endoscopy in the near future. He was initially seen in the office in late January 2016. He denies any changes to his medical history since he was initially seen. He denies any trips the emergency room. He is checking his blood sugars little more regularly because of the low carbohydrate diet he is on for the preoperative diet. He denies any fever, chills, nausea, vomiting, chest pain, chest pressure, shortness of breath.  His preoperative workup was unremarkable. His chest x-ray and EKG were within normal limits. His initial bariatric surgery evaluation labs were unremarkable except for hemoglobin A1c of 8.3 which was improved. His upper GI showed a small hiatal hernia. He denies reflux.  Dr. Buddy Duty continues to manage his blood sugars.   Other Problems [name], MD; 06/02/2014 11:36 AM) Cancer Hemorrhoids DYSLIPIDEMIA (272.4  E78.5) HTN (HYPERTENSION), BENIGN (401.1  I10) DIABETES MELLITUS, INSULIN DEPENDENT (IDDM), CONTROLLED (250.01  E10.9) OBESITY, MORBID, BMI 40.0-49.9 (278.01  E66.01)  Past Surgical History [name], MD; 06/02/2014 11:36 AM) Knee Surgery Left. Oral Surgery Appendectomy Vasectomy  Diagnostic Studies History [name], MD; 06/02/2014 11:36 AM) Colonoscopy never  Allergies ([name], LPN; 1/94/1740 81:44 AM) No Known Drug Allergies01/28/2016  Medication History [name], MD; 06/02/2014 11:36 AM) [name] (18MG Fayne Mediate Soln Pen-inj, Subcutaneous daily) Active. Enalapril Maleate (5MG  Tablet, Oral  daily) Active. HumuLIN R U-500 (CONCENTRATED) (500UNIT/ML Solution, Subcutaneous as directed) Active. MetFORMIN HCl ER (500MG  Tablet ER 24HR, Oral daily) Active. Simvastatin (20MG  Tablet, Oral daily) Active. OxyCODONE HCl (5MG /5ML Solution, 5-10 Milliliter Oral every four hours, as needed, Taken starting 06/02/2014) Active.  Social History [name], MD; 06/02/2014 11:36 AM) Caffeine use Carbonated beverages, Coffee, Tea. No drug use Alcohol use Occasional alcohol use. Tobacco use Former smoker.  Family History [name], MD; 06/02/2014 11:36 AM) Heart Disease Father. Hypertension Father. Cerebrovascular Accident Father. Diabetes Mellitus Father, Sister.  Review of Systems [name], MD; 06/02/2014 11:32 00) General Present- Weight Gain. Not Present- Appetite Loss, Chills, Fatigue, Fever, Night Sweats and Weight Loss. Skin Present- Ulcer. Not Present- Change in Wart/Mole, Dryness, Hives, Jaundice, New Lesions, Non-Healing Wounds and Rash. HEENT Present- Wears glasses/contact lenses. Not Present- Earache, Hearing Loss, Hoarseness, Nose Bleed, Oral Ulcers, Ringing in the Ears, Seasonal Allergies, Sinus Pain, Sore Throat, Visual Disturbances and Yellow Eyes. Respiratory Not Present- Bloody sputum, Chronic Cough, Difficulty Breathing, Snoring and Wheezing. Breast Not Present- Breast Mass, Breast Pain, Nipple Discharge and Skin Changes. Cardiovascular Not Present- Chest Pain, Difficulty Breathing Lying Down, Leg Cramps, Palpitations, Rapid Heart Rate, Shortness of Breath and Swelling of Extremities. Gastrointestinal Not Present- Abdominal Pain, Bloating, Bloody Stool, Change in Bowel Habits, Chronic diarrhea, Constipation, Difficulty Swallowing, Excessive gas, Gets full quickly at meals, Hemorrhoids, Indigestion, Nausea, Rectal Pain and Vomiting. Male Genitourinary Present- Impotence. Not Present- Blood in Urine, Change in Urinary Stream, Frequency, Nocturia, Painful  Urination, Urgency and Urine Leakage. Musculoskeletal Not Present- Back Pain, Joint Pain, Joint Stiffness, Muscle Pain, Muscle Weakness and Swelling of Extremities. Neurological Not Present- Decreased Memory, Fainting, Headaches, Numbness, Seizures, Tingling, Tremor, Trouble walking and Weakness. Psychiatric Not  Present- Anxiety, Bipolar, Change in Sleep Pattern, Depression, Fearful and Frequent crying. Endocrine Not Present- Cold Intolerance, Excessive Hunger, Hair Changes, Heat Intolerance and New Diabetes. Hematology Not Present- Easy Bruising, Excessive bleeding, Gland problems, HIV and Persistent Infections.   Vitals ([name] LPN; 9/92/4268 34:19 AM) 06/02/2014 11:09 AM Weight: 305.2 lb Height: 73in Body Surface Area: 2.67 m Body Mass Index: 40.27 kg/m Temp.: 98.76F(Oral)  Pulse: 88 (Regular)  Resp.: 16 (Unlabored)  BP: 132/88 (Sitting, Left Arm, Standard)    Physical Exam Randall Hiss M. Arlette Schaad MD; 06/02/2014 11:32 AM) General Mental Status-Alert. General Appearance-Consistent with stated age. Hydration-Well hydrated. Voice-Normal. Note: morbidly obese, central truncal obesity;   Head and Neck Head-normocephalic, atraumatic with no lesions or palpable masses. Trachea-midline. Thyroid Gland Characteristics - normal size and consistency.  Eye Eyeball - Bilateral-Extraocular movements intact. Sclera/Conjunctiva - Bilateral-No scleral icterus.  Chest and Lung Exam Chest and lung exam reveals -quiet, even and easy respiratory effort with no use of accessory muscles and on auscultation, normal breath sounds, no adventitious sounds and normal vocal resonance. Inspection Chest Wall - Normal. Back - normal.  Breast - Did not examine.  Cardiovascular Cardiovascular examination reveals -normal heart sounds, regular rate and rhythm with no murmurs and normal pedal pulses bilaterally.  Abdomen Inspection Inspection of the abdomen reveals -  No Hernias. Skin - Scar - no surgical scars. Palpation/Percussion Palpation and Percussion of the abdomen reveal - Soft, Non Tender, No Rebound tenderness, No Rigidity (guarding) and No hepatosplenomegaly. Auscultation Auscultation of the abdomen reveals - Bowel sounds normal.  Peripheral Vascular Upper Extremity Palpation - Pulses bilaterally normal.  Neurologic Neurologic evaluation reveals -alert and oriented x 3 with no impairment of recent or remote memory. Mental Status-Normal.  Neuropsychiatric The patient's mood and affect are described as -normal. Judgment and Insight-insight is appropriate concerning matters relevant to self.  Musculoskeletal Normal Exam - Left-Upper Extremity Strength Normal and Lower Extremity Strength Normal. Normal Exam - Right-Upper Extremity Strength Normal and Lower Extremity Strength Normal.  Lymphatic Head & Neck  General Head & Neck Lymphatics: Bilateral - Description - Normal. Axillary - Did not examine. Femoral & Inguinal - Did not examine.    Assessment & Plan Randall Hiss M. Avrianna Smart MD; 06/02/2014 11:36 AM) OBESITY, MORBID, BMI 40.0-49.9 (278.01  E66.01) Impression: We reviewed his workup today. We discussed his laboratory results. We rediscussed the typical postoperative course. He was given his postoperative pain medicine prescription today. We explained that after surgery on discharge he will probably go home on sliding scale insulin and he'll need to keep a log of his blood sugars. I explained that he will need to be in contact more frequently with his endocrinologist to help manage his his insulin requirement. He is also given his instructions on his bowel prep for the day before surgery. All of his questions were asked and answered. Current Plans  Instructions: Congratulations on starting your journey to a healthier life! Over the next few weeks you will be undergoing tests (x-rays and labs) and seeing specialists to help  evaluate you for weight loss surgery. These tests and consultations with a psychologist and nutritionist are needed to prepare you for the lifestyle changes that lie ahead and are often required by insurance companies to approve you for surgery. Please call me if you have any questions during the evaluation.  Pathway to Surgery:  Two weeks prior to surgery Go on the extremely low carb liquid diet - this will decrease the size of your liver which will make  surgery safer - the nutritionist will go over this at a later date Attend preoperative appointment with your surgeon Attend preoperative surgery class  One week prior to surgery No aspirin products. Tylenol is acceptable  24 hours prior to surgery No alcoholic beverages Report fever greater than 100.5 or excessive nasal drainage suggesting infection Continue bariatric preop diet Perform bowel prep if ordered Do not eat or drink anything after midnight the night before surgery Do not take any medications except those instructed by the anesthesiologist  Morning of surgery Please arrive at the hospital at least 2 hours before your scheduled surgery time. No makeup, fingernail polish or jewelry Bring insurance cards with you Bring your CPAP mask if you use this Started OxyCODONE HCl 5MG /5ML, 5-10 Milliliter every four hours, as needed, 200 Milliliter, 06/02/2014, No Refill. DIABETES MELLITUS, INSULIN DEPENDENT (IDDM), CONTROLLED (250.01  E10.9) Impression: Managed per her endocrinologist HTN (HYPERTENSION), BENIGN (401.1  I10) Impression: Managed per PCP DYSLIPIDEMIA (272.4  E78.5) Impression: Managed per PCP  Leighton Ruff. Redmond Pulling, MD, FACS General, Bariatric, & Minimally Invasive Surgery [facility], [place]

## 2014-06-14 NOTE — Op Note (Signed)
Upper GI endoscopy is performed at the completion of laparoscopic Roux-en-Y gastric bypass by Dr. Redmond Pulling. The Olympus video endoscope was inserted into the upper esophagus and then passed under direct vision to the EG junction at about 50 cm. The small gastric pouch was insufflated with air while the gastric outlet was clamped under irrigation by the operating surgeon. There was no evidence of leak. The anastomosis was visualized and was patent. Suture and staple lines were intact and without bleeding. The pouch was tubular and measured 4-5 cm in length. At the completion of the procedure the pouch was desufflated and the scope withdrawn.

## 2014-06-14 NOTE — Anesthesia Postprocedure Evaluation (Signed)
Anesthesia Post Note  Patient: Eric Juarez  Procedure(s) Performed: Procedure(s) (LRB): LAPAROSCOPIC ROUX-EN-Y GASTRIC BYPASS WITH UPPER ENDOSCOPY POSSIBLE HIATAL HERNIA (N/A)  Anesthesia type: General  Patient location: PACU  Post pain: Pain level controlled  PACU course: Severe insulin resistance. Given several doses of insulin given with minimal effect. Discussed with Redmond Pulling possible need for concentrated insulin.  Post assessment: Post-op Vital signs reviewed  Last Vitals: BP 148/86 mmHg  Pulse 90  Temp(Src) 37 C (Oral)  Resp 12  Ht 6' (1.829 m)  Wt 293 lb 8 oz (133.131 kg)  BMI 39.80 kg/m2  SpO2 98%  Post vital signs: Reviewed  Level of consciousness: sedated  Complications: No apparent anesthesia complications

## 2014-06-14 NOTE — Anesthesia Preprocedure Evaluation (Addendum)
Anesthesia Evaluation  Patient identified by MRN, date of birth, ID band Patient awake    Reviewed: Allergy & Precautions, NPO status , Patient's Chart, lab work & pertinent test results  Airway Mallampati: II  TM Distance: >3 FB Neck ROM: Full    Dental no notable dental hx.    Pulmonary former smoker,  breath sounds clear to auscultation  Pulmonary exam normal       Cardiovascular hypertension, Pt. on medications Rhythm:Regular Rate:Normal     Neuro/Psych negative neurological ROS  negative psych ROS   GI/Hepatic negative GI ROS, Neg liver ROS,   Endo/Other  diabetes, Type 2, Insulin Dependent, Oral Hypoglycemic AgentsMorbid obesity  Renal/GU negative Renal ROS  negative genitourinary   Musculoskeletal  (+) Arthritis -,   Abdominal   Peds negative pediatric ROS (+)  Hematology negative hematology ROS (+)   Anesthesia Other Findings   Reproductive/Obstetrics negative OB ROS                           Anesthesia Physical Anesthesia Plan  ASA: III  Anesthesia Plan: General   Post-op Pain Management:    Induction: Intravenous  Airway Management Planned: Oral ETT  Additional Equipment:   Intra-op Plan:   Post-operative Plan: Extubation in OR  Informed Consent: I have reviewed the patients History and Physical, chart, labs and discussed the procedure including the risks, benefits and alternatives for the proposed anesthesia with the patient or authorized representative who has indicated his/her understanding and acceptance.   Dental advisory given  Plan Discussed with: CRNA  Anesthesia Plan Comments:        Anesthesia Quick Evaluation

## 2014-06-14 NOTE — Interval H&P Note (Signed)
History and Physical Interval Note:  06/14/2014 7:07 AM  Eric Juarez  has presented today for surgery, with the diagnosis of Morbid Obesity  The various methods of treatment have been discussed with the patient and family. After consideration of risks, benefits and other options for treatment, the patient has consented to  Procedure(s): LAPAROSCOPIC ROUX-EN-Y GASTRIC BYPASS WITH UPPER ENDOSCOPY POSSIBLE HIATAL HERNIA (N/A) as a surgical intervention .  The patient's history has been reviewed, patient examined, no change in status, stable for surgery.  I have reviewed the patient's chart and labs.  Questions were answered to the patient's satisfaction.    Leighton Ruff. Redmond Pulling, MD, [place], Bariatric, & Minimally Invasive Surgery [facility], [place]   [facility]

## 2014-06-14 NOTE — Transfer of Care (Signed)
Immediate Anesthesia Transfer of Care Note  Patient: Eric Juarez  Procedure(s) Performed: Procedure(s): LAPAROSCOPIC ROUX-EN-Y GASTRIC BYPASS WITH UPPER ENDOSCOPY POSSIBLE HIATAL HERNIA (N/A)  Patient Location: PACU  Anesthesia Type:General  Level of Consciousness: awake, alert , oriented and patient cooperative  Airway & Oxygen Therapy: Patient Spontanous Breathing and Patient connected to face mask oxygen  Post-op Assessment: Report given to RN, Post -op Vital signs reviewed and stable and Patient moving all extremities  Post vital signs: Reviewed and stable  Last Vitals:  Filed Vitals:   06/14/14 0509  BP: 134/78  Pulse: 76  Temp: 36.9 C  Resp: 18    Complications: No apparent anesthesia complications

## 2014-06-14 NOTE — Anesthesia Procedure Notes (Signed)
Procedure Name: Intubation Date/Time: 06/14/2014 7:22 AM Performed by: Montel Clock Pre-anesthesia Checklist: Patient identified, Emergency Drugs available, Suction available, Patient being monitored and Timeout performed Patient Re-evaluated:Patient Re-evaluated prior to inductionOxygen Delivery Method: Circle system utilized Preoxygenation: Pre-oxygenation with 100% oxygen Intubation Type: IV induction Ventilation: Mask ventilation without difficulty and Oral airway inserted - appropriate to patient size Laryngoscope Size: Mac and 4 Grade View: Grade I Tube type: Oral Tube size: 7.5 mm Number of attempts: 1 Airway Equipment and Method: Stylet and Oral airway Placement Confirmation: ETT inserted through vocal cords under direct vision,  positive ETCO2 and breath sounds checked- equal and bilateral Secured at: 22 cm Tube secured with: Tape Dental Injury: Teeth and Oropharynx as per pre-operative assessment

## 2014-06-14 NOTE — Op Note (Signed)
Eric Juarez [id number] Sep 02, 1955. 06/14/2014  Preoperative diagnosis:  1. Morbid obesity (BMI 40)  2. Hypertension 3. Diabetes Mellitus 4. Dyslipidemia  Postoperative  diagnosis:  1. Same + cirrhotic appearing liver   Surgical procedure: Laparoscopic Roux-en-Y gastric bypass (ante-colic, ante-gastric); upper endoscopy  Surgeon: [name], M.D. FACS  Asst.: Excell Seltzer, MD FACS  Anesthesia: General plus 70 cc exparel  Complications: None   EBL: Minimal   Drains: None   Disposition: PACU in good condition   Indications for procedure: 59yo WM with morbid obesity who has been unsuccessful at sustained weight loss. The patient's comorbidities are listed above. We discussed the risk and benefits of surgery including but not limited to anesthesia risk, bleeding, infection, blood clot formation, anastomotic leak, anastomotic stricture, ulcer formation, death, respiratory complications, intestinal blockage, internal hernia, gallstone formation, vitamin and nutritional deficiencies, injury to surrounding structures, failure to lose weight and mood changes.   Description of procedure: Patient is brought to the operating room and general anesthesia induced. The patient had received preoperative broad-spectrum IV antibiotics and subcutaneous heparin and IV tylenol. The abdomen was widely sterilely prepped with Chloraprep and draped. Patient timeout was performed and correct patient and procedure confirmed. Access was obtained with a 12 mm Optiview trocar in the left upper quadrant and pneumoperitoneum established without difficulty. Under direct vision 12 mm trocars were placed laterally in the right upper quadrant, right upper quadrant midclavicular line, and to the left and above the umbilicus for the camera port. A 5 mm trocar was placed laterally in the left upper quadrant.  The omentum was brought into the upper abdomen after taking down a single adhesive band and the transverse  mesocolon elevated and the ligament of Treitz clearly identified. A 40 cm biliopancreatic limb was then carefully measured from the ligament of Treitz. The small intestine was divided at this point with a single firing of the white load linear stapler. A Penrose drain was sutured to the end of the Roux-en-Y limb for later identification. A 100 cm Roux-en-Y limb was then carefully measured. At this point a side-to-side anastomosis was created between the Roux limb and the end of the biliopancreatic limb. This was accomplished with a single firing of the 60 mm white load linear stapler. The common enterotomy was closed with a running 2-0 Vicryl begun at either end of the enterotomy and tied centrally. Tisseel tissue sealant was placed over the anastomosis. The mesenteric defect was then closed with running 2-0 silk. The omentum was then divided with the harmonic scalpel up towards the transverse colon to allow mobility of the Roux limb toward the gastric pouch. The patient was then placed in steep reversed Trendelenburg. Through a 5 mm subxiphoid site the [facility] retractor was placed and the left lobe of the liver elevated with excellent exposure of the upper stomach and hiatus. The angle of Hiss was then mobilized with the harmonic scalpel. A 4 cm gastric pouch was then carefully measured along the lesser curve of the stomach. Dissection was carried along the lesser curve at this point with the Harmonic scalpel working carefully back toward the lesser sac at right angles to the lesser curve. The free lesser sac was then entered. After being sure all tubes were removed from the stomach an initial firing of the gold load 60 mm linear stapler was fired at right angles across the lesser curve for about 4 cm. The gastric pouch was further mobilized posteriorly and then the pouch was completed with 51further  firings of the 60 mm blue load linear stapler (last 2 firings with seamguard) up through the previously dissected  angle of His. It was ensured that the pouch was completely mobilized away from the gastric remnant. This created a nice tubular 4-5 cm gastric pouch. The Roux limb was then brought up in an antecolic fashion with the candycane facing to the patient's left without undue tension. The gastrojejunostomy was created with an initial posterior row of 2-0 Vicryl between the Roux limb and the staple line of the gastric pouch. Enterotomies were then made in the gastric pouch and the Roux limb with the harmonic scalpel and an approximately 2-2-1/2 cm anastomosis was created with a single firing of the 44mm blue load linear stapler. The staple line was inspected and was intact without bleeding. The common enterotomy was then closed with running 2-0 Vicryl begun at either end and tied centrally. The Ewall tube was then easily passed through the anastomosis and an outer anterior layer of running 2-0 Vicryl was placed. The Ewald tube was removed. With the outlet of the gastrojejunostomy clamped and under saline irrigation the assistant performed upper endoscopy and with the gastric pouch tensely distended with air-there was no evidence of leak on this test. The pouch was desufflated. The Terance Hart defect was closed with running 2-0 silk. The abdomen was inspected for any evidence of bleeding or bowel injury and everything looked fine. The Nathanson retractor was removed under direct vision after coating the anastomosis with Tisseel tissue sealant. Exparel was then infiltrated around all trocar sites. All CO2 was evacuated and trochars removed. Skin incisions were closed with 4-0 monocryl in a subcuticular fashion followed by Liquidband exceed. Sponge needle and instrument counts were correct. The patient was taken to the PACU in good condition.    Leighton Ruff. Redmond Pulling, MD, FACS General, Bariatric, & Minimally Invasive Surgery [facility], [place]

## 2014-06-15 ENCOUNTER — Ambulatory Visit: Payer: 59

## 2014-06-15 ENCOUNTER — Inpatient Hospital Stay (HOSPITAL_COMMUNITY): Payer: 59

## 2014-06-15 ENCOUNTER — Encounter (HOSPITAL_COMMUNITY): Payer: Self-pay | Admitting: General Surgery

## 2014-06-15 LAB — COMPREHENSIVE METABOLIC PANEL
ALT: 45 U/L (ref 0–53)
ANION GAP: 8 (ref 5–15)
AST: 40 U/L — ABNORMAL HIGH (ref 0–37)
Albumin: 3.7 g/dL (ref 3.5–5.2)
Alkaline Phosphatase: 73 U/L (ref 39–117)
BUN: 10 mg/dL (ref 6–23)
CALCIUM: 8.6 mg/dL (ref 8.4–10.5)
CO2: 25 mmol/L (ref 19–32)
Chloride: 104 mmol/L (ref 96–112)
Creatinine, Ser: 0.73 mg/dL (ref 0.50–1.35)
GFR calc non Af Amer: >90 mL/min (ref >90)
GLUCOSE: 222 mg/dL — AB (ref 70–99)
Potassium: 3.7 mmol/L (ref 3.5–5.1)
SODIUM: 137 mmol/L (ref 135–145)
Total Bilirubin: 0.7 mg/dL (ref 0.3–1.2)
Total Protein: 6.2 g/dL (ref 6.0–8.3)

## 2014-06-15 LAB — HEMOGLOBIN AND HEMATOCRIT, BLOOD
HCT: 41.6 % (ref 39.0–52.0)
Hemoglobin: 14.1 g/dL (ref 13.0–17.0)

## 2014-06-15 LAB — CBC WITH DIFFERENTIAL/PLATELET
BASOS ABS: 0 10*3/uL (ref 0.0–0.1)
BASOS PCT: 0 % (ref 0–1)
Eosinophils Absolute: 0 10*3/uL (ref 0.0–0.7)
Eosinophils Relative: 0 % (ref 0–5)
HCT: 37.9 % — ABNORMAL LOW (ref 39.0–52.0)
Hemoglobin: 12.8 g/dL — ABNORMAL LOW (ref 13.0–17.0)
Lymphocytes Relative: 11 % — ABNORMAL LOW (ref 12–46)
Lymphs Abs: 1.5 10*3/uL (ref 0.7–4.0)
MCH: 29 pg (ref 26.0–34.0)
MCHC: 33.8 g/dL (ref 30.0–36.0)
MCV: 85.7 fL (ref 78.0–100.0)
Monocytes Absolute: 1.3 10*3/uL — ABNORMAL HIGH (ref 0.1–1.0)
Monocytes Relative: 9 % (ref 3–12)
NEUTROS ABS: 10.9 10*3/uL — AB (ref 1.7–7.7)
NEUTROS PCT: 80 % — AB (ref 43–77)
Platelets: 175 10*3/uL (ref 150–400)
RBC: 4.42 MIL/uL (ref 4.22–5.81)
RDW: 13.5 % (ref 11.5–15.5)
WBC: 13.7 10*3/uL — ABNORMAL HIGH (ref 4.0–10.5)

## 2014-06-15 LAB — GLUCOSE, CAPILLARY
GLUCOSE-CAPILLARY: 181 mg/dL — AB (ref 70–99)
GLUCOSE-CAPILLARY: 207 mg/dL — AB (ref 70–99)
GLUCOSE-CAPILLARY: 232 mg/dL — AB (ref 70–99)
Glucose-Capillary: 174 mg/dL — ABNORMAL HIGH (ref 70–99)
Glucose-Capillary: 146 mg/dL — ABNORMAL HIGH (ref 70–99)
Glucose-Capillary: 185 mg/dL — ABNORMAL HIGH (ref 70–99)

## 2014-06-15 MED ORDER — ALUM & MAG HYDROXIDE-SIMETH 200-200-20 MG/5ML PO SUSP
15.0000 mL | Freq: Four times a day (QID) | ORAL | Status: DC | PRN
  Administered 2014-06-15: 15 mL via ORAL
  Filled 2014-06-15: qty 30

## 2014-06-15 MED ORDER — IOHEXOL 300 MG/ML  SOLN
50.0000 mL | Freq: Once | INTRAMUSCULAR | Status: AC | PRN
  Administered 2014-06-15: 50 mL via ORAL

## 2014-06-15 NOTE — Plan of Care (Signed)
Problem: Food- and Nutrition-Related Knowledge Deficit (NB-1.1) Goal: Nutrition education Formal process to instruct or train a patient/client in a skill or to impart knowledge to help patients/clients voluntarily manage or modify food choices and eating behavior to maintain or improve health. Outcome: Completed/Met Date Met:  06/15/14 Nutrition Education Note  Received consult for diet education per DROP protocol.   Discussed 2 week post op diet with pt. Emphasized that liquids must be non carbonated, non caffeinated, and sugar free. Fluid goals discussed. Pt to follow up with outpatient bariatric RD for further diet progression after 2 weeks. Multivitamins and minerals also reviewed. Teach back method used, pt expressed understanding, expect good compliance.   Diet: First 2 Weeks  You will see the nutritionist about two (2) weeks after your surgery. The nutritionist will increase the types of foods you can eat if you are handling liquids well:  If you have severe vomiting or nausea and cannot handle clear liquids lasting longer than 1 day, call your surgeon  Protein Shake  Drink at least 2 ounces of shake 5-6 times per day  Each serving of protein shakes (usually 8 - 12 ounces) should have a minimum of:  15 grams of protein  And no more than 5 grams of carbohydrate  Goal for protein each day:  Men = 80 grams per day  Women = 60 grams per day  Protein powder may be added to fluids such as non-fat milk or Lactaid milk or Soy milk (limit to 35 grams added protein powder per serving)   Hydration  Slowly increase the amount of water and other clear liquids as tolerated (See Acceptable Fluids)  Slowly increase the amount of protein shake as tolerated  Sip fluids slowly and throughout the day  May use sugar substitutes in small amounts (no more than 6 - 8 packets per day; i.e. Splenda)   Fluid Goal  The first goal is to drink at least 8 ounces of protein shake/drink per day (or as directed  by the nutritionist); some examples of protein shakes are [employer], [employer], EAS Edge HP, and Unjury. See handout from pre-op Bariatric Education Class:  Slowly increase the amount of protein shake you drink as tolerated  You may find it easier to slowly sip shakes throughout the day  It is important to get your proteins in first  Your fluid goal is to drink 64 - 100 ounces of fluid daily  It may take a few weeks to build up to this  32 oz (or more) should be clear liquids  And  32 oz (or more) should be full liquids (see below for examples)  Liquids should not contain sugar, caffeine, or carbonation   Clear Liquids:  Water or Sugar-free flavored water (i.e. Fruit H2O, Propel)  Decaffeinated coffee or tea (sugar-free)  Crystal Lite, Wyler's Lite, Minute Maid Lite  Sugar-free Jell-O  Bouillon or broth  Sugar-free Popsicle: *Less than 20 calories each; Limit 1 per day   Full Liquids:  Protein Shakes/Drinks + 2 choices per day of other full liquids  Full liquids must be:  No More Than 12 grams of Carbs per serving  No More Than 3 grams of Fat per serving  Strained low-fat cream soup  Non-Fat milk  Fat-free Lactaid Milk  Sugar-free yogurt (Dannon Lite & Fit, Greek yogurt)     Clayton Bibles, MS, RD, LDN Pager: [phone] After Hours Pager: [phone]

## 2014-06-15 NOTE — Progress Notes (Signed)
Patient alert and oriented, Post op day 1.  Provided support and encouragement.  Encouraged pulmonary toilet, ambulation and small sips of liquids.  All questions answered.  Will continue to monitor. 

## 2014-06-15 NOTE — Care Management Note (Signed)
    Page 1 of 1   06/15/2014     11:24:37 AM CARE MANAGEMENT NOTE 06/15/2014  Patient:  Eric Juarez, Eric Juarez   Account Number:  [id number]  Date Initiated:  06/15/2014  Documentation initiated by:  Sunday Spillers  Subjective/Objective Assessment:   58 yo male admitted s/p gastric bypass. PTA lived at home with spouse.     Action/Plan:   Home when stable   Anticipated DC Date:  06/17/2014   Anticipated DC Plan:  [place]  CM consult      Choice offered to / List presented to:             Status of service:  Completed, signed off Medicare Important Message given?   (If response is "NO", the following Medicare IM given date fields will be blank) Date Medicare IM given:   Medicare IM given by:   Date Additional Medicare IM given:   Additional Medicare IM given by:    Discharge Disposition:  HOME/SELF CARE  Per UR Regulation:  Reviewed for med. necessity/level of care/duration of stay  If discussed at [place] of Stay Meetings, dates discussed:    Comments:

## 2014-06-15 NOTE — Progress Notes (Signed)
1 Day Post-Op  Subjective: Some gas pain; ambulated about 12 times yesterday. Was able to get some sleep. No n/v.   Objective: Vital signs in last 24 hours: Temp:  [98 F (36.7 C)-99.4 F (37.4 C)] 99.3 F (37.4 C) (04/12 1000) Pulse Rate:  [65-93] 65 (04/12 1000) Resp:  [10-18] 12 (04/12 1000) BP: (116-167)/(61-91) 116/63 mmHg (04/12 1000) SpO2:  [94 %-100 %] 98 % (04/12 1000) Weight:  [135.172 kg (298 lb)] 135.172 kg (298 lb) (04/12 0448) Last BM Date: 06/13/14  Intake/Output from previous day: 04/11 0701 - 04/12 0700 In: 5737.5 [I.V.:5737.5] Out: 2350 [Urine:2300; Blood:50] Intake/Output this shift: Total I/O In: -  Out: 200 [Urine:200]  Alert, nontoxic, resting comfortably cta b/l Reg Obese, soft, incisions c/d/i; mild approp TTP No edema  Lab Results:   Recent Labs  06/14/14 1103 06/15/14 0541  WBC  --  13.7*  HGB 14.5 12.8*  HCT 42.6 37.9*  PLT  --  175   BMET  Recent Labs  06/15/14 0541  NA 137  K 3.7  CL 104  CO2 25  GLUCOSE 222*  BUN 10  CREATININE 0.73  CALCIUM 8.6   PT/INR No results for input(s): LABPROT, INR in the last 72 hours. ABG No results for input(s): PHART, HCO3 in the last 72 hours.  Invalid input(s): PCO2, PO2  Studies/Results: No results found.  Anti-infectives: Anti-infectives    Start     Dose/Rate Route Frequency Ordered Stop   06/14/14 0509  cefoTEtan in Dextrose 5% (CEFOTAN) IVPB 2 g     2 g Intravenous On call to O.R. 06/14/14 0509 06/14/14 0724      Assessment/Plan: s/p Procedure(s): LAPAROSCOPIC ROUX-EN-Y GASTRIC BYPASS WITH UPPER ENDOSCOPY POSSIBLE HIATAL HERNIA (N/A)  No fever, tachycardia. UGI looks ok to me. Will start POD 1 diet DM2- some hyperglycemia. Cont SSI VTE prophylaxis - cont scd, ambulation, prophylactic lovenox Heme - hgb ok  Leighton Ruff. Redmond Pulling, MD, FACS General, Bariatric, & Minimally Invasive Surgery [facility], [place]   LOS: 1 day    [name] 06/15/2014

## 2014-06-15 NOTE — Consult Note (Signed)
   [facility] CM Inpatient Consult   06/15/2014  DELMO MATTY 08-16-1955 [id number]   Came to visit patient at bedside on behalf of [employer] program for [place] employees/dependents with [employer]. Mr Koskela is currently active with [employer] for DM management. Will make inpatient RNCM aware.   Marthenia Rolling, MSN-Ed, RN,BSN [facility] Liaison [phone]

## 2014-06-16 DIAGNOSIS — I10 Essential (primary) hypertension: Secondary | ICD-10-CM | POA: Diagnosis present

## 2014-06-16 DIAGNOSIS — Z9884 Bariatric surgery status: Secondary | ICD-10-CM

## 2014-06-16 DIAGNOSIS — E785 Hyperlipidemia, unspecified: Secondary | ICD-10-CM | POA: Diagnosis present

## 2014-06-16 LAB — CBC WITH DIFFERENTIAL/PLATELET
Basophils Absolute: 0 10*3/uL (ref 0.0–0.1)
Basophils Relative: 0 % (ref 0–1)
Eosinophils Absolute: 0.1 10*3/uL (ref 0.0–0.7)
Eosinophils Relative: 1 % (ref 0–5)
HEMATOCRIT: 41.4 % (ref 39.0–52.0)
Hemoglobin: 13.8 g/dL (ref 13.0–17.0)
LYMPHS ABS: 1.8 10*3/uL (ref 0.7–4.0)
Lymphocytes Relative: 14 % (ref 12–46)
MCH: 28.9 pg (ref 26.0–34.0)
MCHC: 33.3 g/dL (ref 30.0–36.0)
MCV: 86.6 fL (ref 78.0–100.0)
MONO ABS: 1.4 10*3/uL — AB (ref 0.1–1.0)
Monocytes Relative: 11 % (ref 3–12)
Neutro Abs: 9.8 10*3/uL — ABNORMAL HIGH (ref 1.7–7.7)
Neutrophils Relative %: 74 % (ref 43–77)
Platelets: 180 10*3/uL (ref 150–400)
RBC: 4.78 MIL/uL (ref 4.22–5.81)
RDW: 13.4 % (ref 11.5–15.5)
WBC: 13.2 10*3/uL — AB (ref 4.0–10.5)

## 2014-06-16 LAB — GLUCOSE, CAPILLARY
GLUCOSE-CAPILLARY: 206 mg/dL — AB (ref 70–99)
GLUCOSE-CAPILLARY: 245 mg/dL — AB (ref 70–99)
Glucose-Capillary: 175 mg/dL — ABNORMAL HIGH (ref 70–99)
Glucose-Capillary: 236 mg/dL — ABNORMAL HIGH (ref 70–99)

## 2014-06-16 MED ORDER — OXYCODONE HCL 5 MG/5ML PO SOLN: 5.0000 mg | ORAL | Status: AC | PRN

## 2014-06-16 MED ORDER — INSULIN ASPART 100 UNIT/ML ~~LOC~~ SOLN: 0.0000 [IU] | SUBCUTANEOUS | Status: AC

## 2014-06-16 MED ORDER — PANTOPRAZOLE SODIUM 40 MG PO TBEC: 40.0000 mg | DELAYED_RELEASE_TABLET | Freq: Every day | ORAL | Status: AC

## 2014-06-16 NOTE — Discharge Instructions (Signed)

## 2014-06-16 NOTE — Progress Notes (Signed)
2 Days Post-Op  Subjective: No nausea. Tolerating water. Still with some gas. No regurgitation. Ambulated multiple times.   Objective: Vital signs in last 24 hours: Temp:  [98.7 F (37.1 C)-99.9 F (37.7 C)] 98.7 F (37.1 C) (04/13 0600) Pulse Rate:  [65-90] 90 (04/13 0600) Resp:  [12-18] 18 (04/13 0600) BP: (116-154)/(63-78) 154/78 mmHg (04/13 0600) SpO2:  [96 %-98 %] 96 % (04/13 0600) Weight:  [133.312 kg (293 lb 14.4 oz)] 133.312 kg (293 lb 14.4 oz) (04/13 0600) Last BM Date: 06/13/14  Intake/Output from previous day: 04/12 0701 - 04/13 0700 In: 3147.1 [P.O.:120; I.V.:3027.1] Out: 3925 [Urine:3925] Intake/Output this shift:    Alert, nontoxic, in chair cta b/l Reg  Obese, soft, mild approp TTP. Incisions c/d/i No edema  Lab Results:   Recent Labs  06/15/14 0541 06/15/14 1616 06/16/14 0437  WBC 13.7*  --  13.2*  HGB 12.8* 14.1 13.8  HCT 37.9* 41.6 41.4  PLT 175  --  180   BMET  Recent Labs  06/15/14 0541  NA 137  K 3.7  CL 104  CO2 25  GLUCOSE 222*  BUN 10  CREATININE 0.73  CALCIUM 8.6   PT/INR No results for input(s): LABPROT, INR in the last 72 hours. ABG No results for input(s): PHART, HCO3 in the last 72 hours.  Invalid input(s): PCO2, PO2  Studies/Results: Dg Ugi W/water Sol Cm  06/15/2014   CLINICAL DATA:  Postop for roux-en-y gastrojejunostomy.  EXAM: WATER SOLUBLE UPPER GI SERIES  TECHNIQUE: Single-column upper GI series was performed using water soluble contrast.  CONTRAST:  41mL OMNIPAQUE IOHEXOL 300 MG/ML  SOLN  COMPARISON:  04/23/2014  FLUOROSCOPY TIME:  Radiation Exposure Index (as provided by the fluoroscopic device):  If the device does not provide the exposure index:  Fluoroscopy Time (in minutes and seconds):  0 minutes, 51 seconds  Number of Acquired Images:  3  FINDINGS: Initial scout image of the upper abdomen appears normal.  Expected small gastric pouch observed. Patent gastrojejunostomy without leak. Contrast medium flow as  from the gastrojejunostomy into a loop of jejunum, extending within the candy cane end and within the efferent jejunum. No complicating feature observed.  IMPRESSION: 1. Patent gastrojejunostomy with expected postoperative appearance, and no leak or complicating feature observed. These results were called by telephone at the time of interpretation on 06/15/2014 at 11:03 am to Dr. Greer Pickerel , who verbally acknowledged these results.   Electronically Signed   By: Van Clines M.D.   On: 06/15/2014 11:03    Anti-infectives: Anti-infectives    Start     Dose/Rate Route Frequency Ordered Stop   06/14/14 0509  cefoTEtan in Dextrose 5% (CEFOTAN) IVPB 2 g     2 g Intravenous On call to O.R. 06/14/14 0509 06/14/14 0724      Assessment/Plan: s/p Procedure(s): LAPAROSCOPIC ROUX-EN-Y GASTRIC BYPASS WITH UPPER ENDOSCOPY POSSIBLE HIATAL HERNIA (N/A)  Looks good. Adv to pod 2 diet Reassess later this am Discussed dc instructions.   Leighton Ruff. Redmond Pulling, MD, FACS General, Bariatric, & Minimally Invasive Surgery [facility], [place]   LOS: 2 days    [name] 06/16/2014

## 2014-06-16 NOTE — Discharge Summary (Signed)
Physician Discharge Summary  Eric Juarez YQI:[id number] DOB: 10/23/1955 DOA: 06/14/2014  PCP: Simona Huh, MD  Admit date: 06/14/2014 Discharge date: 06/16/2014  Recommendations for Outpatient Follow-up:    Follow-up Information    Follow up with [name], MD. Schedule an appointment as soon as possible for a visit on 06/30/2014.   Specialty:  General Surgery   Why:  10:15 AM (arrive 10 AM) , For wound re-check   Contact information:   [address] [place] [address] [phone]       Follow up with KERR,JEFFREY, MD. Schedule an appointment as soon as possible for a visit in 2 weeks.   Specialty:  Endocrinology   Why:  for diabetes checkup   Contact information:   [address] [place] [address] [phone]      Discharge Diagnoses:  Active Problems:   Diabetes   Morbid obesity   Essential hypertension, benign   Status post gastric bypass for obesity 06/14/14   Dyslipidemia   Surgical Procedure: Laparoscopic Roux-en-Y gastric bypass, upper endoscopy  Discharge Condition: Good Disposition: Home  Diet recommendation: Postoperative gastric bypass diet  Filed Weights   06/14/14 0512 06/15/14 0448 06/16/14 0600  Weight: 133.131 kg (293 lb 8 oz) 135.172 kg (298 lb) 133.312 kg (293 lb 14.4 oz)     Hospital Course:  The patient was admitted for a planned laparoscopic Roux-en-Y gastric bypass. Please see operative note. Preoperatively the patient was given 5000 units of subcutaneous heparin for DVT prophylaxis. Postoperative prophylactic Lovenox dosing was started on the morning of postoperative day 1. The patient underwent an upper GI on postoperative day 1 which demonstrated no extravasation of contrast and emptying of the contrast into the Roux limb. The patient was started on ice chips and water which they tolerated. On postoperative day 2 The patient's diet was advanced to protein shakes which they also tolerated. The patient  was ambulating without difficulty. Their vital signs are stable without fever or tachycardia. Their hemoglobin had remained stable.  The patient had received discharge instructions and counseling. They were deemed stable for discharge.   Discharge Instructions  Discharge Instructions    Ambulate hourly while awake    Complete by:  As directed      Call MD for:  difficulty breathing, headache or visual disturbances    Complete by:  As directed      Call MD for:  persistant dizziness or light-headedness    Complete by:  As directed      Call MD for:  persistant nausea and vomiting    Complete by:  As directed      Call MD for:  redness, tenderness, or signs of infection (pain, swelling, redness, odor or green/yellow discharge around incision site)    Complete by:  As directed      Call MD for:  severe uncontrolled pain    Complete by:  As directed      Call MD for:  temperature >101 F    Complete by:  As directed      Diet bariatric full liquid    Complete by:  As directed      Discharge instructions    Complete by:  As directed   See bariatric discharge instructions  Check blood sugars at least twice a day preferably 3 times a day. If questions call Dr Cindra Eves office.   For blood sugars: 0 to 120  give 0 units of Novolog 121 to 150       Give 3 units 151 to 200       Give 4 units 201 to 250       Give 7 units 251 to 300       Give  11 units 301 to 350       Give 15 units 351 to 400       Give 20 units >400                Call Doctor     Incentive spirometry    Complete by:  As directed   Perform hourly while awake            Medication List    STOP taking these medications        HUMULIN R 500 UNIT/ML Soln injection  Generic drug:  insulin regular human CONCENTRATED     metFORMIN 500 MG (MOD) 24 hr tablet  Commonly known as:  GLUMETZA     VICTOZA 18 MG/3ML Sopn  Generic drug:  Liraglutide      TAKE these medications        enalapril 5 MG tablet   Commonly known as:  VASOTEC  Take 5 mg by mouth every evening.     FLINSTONES GUMMIES OMEGA-3 DHA PO  Take 1 tablet by mouth daily.     insulin aspart 100 UNIT/ML injection  Commonly known as:  novoLOG  Inject 0-20 Units into the skin every 4 (four) hours.     OMEGA 3 PO  Take 2 capsules by mouth daily.     oxyCODONE 5 MG/5ML solution  Commonly known as:  ROXICODONE  Take 5-10 mLs (5-10 mg total) by mouth every 4 (four) hours as needed for moderate pain or severe pain.     pantoprazole 40 MG tablet  Commonly known as:  PROTONIX  Take 1 tablet (40 mg total) by mouth daily.     simvastatin 20 MG tablet  Commonly known as:  ZOCOR  Take 20 mg by mouth daily.           Follow-up Information    Follow up with [name], MD. Schedule an appointment as soon as possible for a visit on 06/30/2014.   Specialty:  General Surgery   Why:  10:15 AM (arrive 10 AM) , For wound re-check   Contact information:   [address] [place] [address] [phone]       Follow up with KERR,JEFFREY, MD. Schedule an appointment as soon as possible for a visit in 2 weeks.   Specialty:  Endocrinology   Why:  for diabetes checkup   Contact information:   [address] [place] [address] [phone]        The results of significant diagnostics from this hospitalization (including imaging, microbiology, ancillary and laboratory) are listed below for reference.    Significant Diagnostic Studies: Dg Ugi W/water Sol Cm  06/15/2014   CLINICAL DATA:  Postop for roux-en-y gastrojejunostomy.  EXAM: WATER SOLUBLE UPPER GI SERIES  TECHNIQUE: Single-column upper GI series was performed using water soluble contrast.  CONTRAST:  18mL OMNIPAQUE IOHEXOL 300 MG/ML  SOLN  COMPARISON:  04/23/2014  FLUOROSCOPY TIME:  Radiation Exposure Index (as provided by the fluoroscopic device):  If the device does not provide the exposure index:  Fluoroscopy Time (in minutes and  seconds):  0 minutes, 51 seconds  Number of Acquired Images:  3  FINDINGS: Initial scout image of the upper abdomen appears normal.  Expected small gastric pouch observed. Patent gastrojejunostomy without leak. Contrast medium flow as from the gastrojejunostomy into a loop of jejunum, extending within the candy cane end and within the efferent jejunum. No complicating feature observed.  IMPRESSION: 1. Patent gastrojejunostomy with expected postoperative appearance, and no leak or complicating feature observed. These results were called by telephone at the time of interpretation on 06/15/2014 at 11:03 am to Dr. Greer Pickerel , who verbally acknowledged these results.   Electronically Signed   By: Van Clines M.D.   On: 06/15/2014 11:03    Labs: Basic Metabolic Panel:  Recent Labs Lab 06/15/14 0541  NA 137  K 3.7  CL 104  CO2 25  GLUCOSE 222*  BUN 10  CREATININE 0.73  CALCIUM 8.6   Liver Function Tests:  Recent Labs Lab 06/15/14 0541  AST 40*  ALT 45  ALKPHOS 73  BILITOT 0.7  PROT 6.2  ALBUMIN 3.7    CBC:  Recent Labs Lab 06/14/14 1103 06/15/14 0541 06/15/14 1616 06/16/14 0437  WBC  --  13.7*  --  13.2*  NEUTROABS  --  10.9*  --  9.8*  HGB 14.5 12.8* 14.1 13.8  HCT 42.6 37.9* 41.6 41.4  MCV  --  85.7  --  86.6  PLT  --  175  --  180    CBG:  Recent Labs Lab 06/15/14 1614 06/15/14 2018 06/16/14 0011 06/16/14 0355 06/16/14 0710  GLUCAP 146* 185* 206* 175* 245*    Active Problems:   Diabetes   Morbid obesity   Essential hypertension, benign   Status post gastric bypass for obesity 06/14/14   Dyslipidemia   Time coordinating discharge: 15 minutes  Signed:  [name], MD [facility], [place] [phone] 06/16/2014, 12:49 PM

## 2014-06-16 NOTE — Progress Notes (Signed)
Patient alert and oriented, pain is controlled. Patient is tolerating fluids,  advanced to protein shake today, patient tolerated well. Reviewed Gastric Bypass discharge instructions with patient and patient is able to articulate understanding. Provided information on BELT program, Support Group and [facility] outpatient pharmacy. All questions answered, will continue to monitor.    

## 2014-06-18 ENCOUNTER — Telehealth (HOSPITAL_COMMUNITY): Payer: Self-pay

## 2014-06-18 NOTE — Telephone Encounter (Signed)
Made discharge phone call to patient per DROP protocol. Asking the following questions.    1. Do you have someone to care for you now that you are home?  yes 2. Are you having pain now that is not relieved by your pain medication?  No, didn't take any yesterday but then I woke up at 1 am and I had to get up and take it 3. Are you able to drink the recommended daily amount of fluids (48 ounces minimum/day) and protein (60-80 grams/day) as prescribed by the dietitian or nutritional counselor?  Yes, drank 3 protein shakes, 1/2 a yogurt, a few sf jell-o, water and powerade zero 4. Are you taking the vitamins and minerals as prescribed?  yes 5. Do you have the "on call" number to contact your surgeon if you have a problem or question?  yes 6. Are your incisions free of redness, swelling or drainage? (If steri strips, address that these can fall off, shower as tolerated) yes, they look really good 7. Have your bowels moved since your surgery?  If not, are you passing gas?  yes 8. Are you up and walking 3-4 times per day?  yes    1. Do you have an appointment made to see your surgeon in the next month?  yes 2. Were you provided your discharge medications before your surgery or before you were discharged from the hospital and are you taking them without problem?  yes 3. Were you provided phone numbers to the clinic/surgeon's office?  yes 4. Did you watch the patient education video module in the (clinic, surgeon's office, etc.) before your surgery? yes 5. Do you have a discharge checklist that was provided to you in the hospital to reference with instructions on how to take care of yourself after surgery? yes  6. Did you see a dietitian or nutritional counselor while you were in the hospital?  yes 7. Do you have an appointment to see a dietitian or nutritional counselor in the next month?  Yes

## 2014-06-29 ENCOUNTER — Encounter: Payer: 59 | Attending: General Surgery

## 2014-06-29 DIAGNOSIS — Z6841 Body Mass Index (BMI) 40.0 and over, adult: Secondary | ICD-10-CM | POA: Diagnosis not present

## 2014-06-29 DIAGNOSIS — Z713 Dietary counseling and surveillance: Secondary | ICD-10-CM | POA: Diagnosis not present

## 2014-06-29 NOTE — Progress Notes (Signed)
Bariatric Class:  Appt start time: 1530 end time:  1630.  2 Week Post-Operative Nutrition Class  Patient was seen on 06/29/2014 for Post-Operative [facility].   Surgery date:  06/14/14 Surgery type: RYGB [facility] weight at [facility]: 308 lbs on 05/03/14 Weight today: 275.5 lbs  Weight change: 30.5 lbs  TANITA  BODY COMP RESULTS  05/31/14 06/29/14   BMI (kg/m^2) 41.5 37.4   Fat Mass (lbs) 122 107.0   Fat Free Mass (lbs) 184 168.5   Total Body Water (lbs) 134.5 123.5    The following the learning objectives were met by the patient during this course:  Identifies Phase 3A (Soft, High Proteins) Dietary Goals and will begin from 2 weeks post-operatively to 2 months post-operatively  Identifies appropriate sources of fluids and proteins   States protein recommendations and appropriate sources post-operatively  Identifies the need for appropriate texture modifications, mastication, and bite sizes when consuming solids  Identifies appropriate multivitamin and calcium sources post-operatively  Describes the need for physical activity post-operatively and will follow MD recommendations  States when to call healthcare provider regarding medication questions or post-operative complications  Handouts given during class include:  Phase 3A: Soft, High Protein Diet Handout  Follow-Up Plan: Patient will follow-up at [facility] in 6 weeks for 2 month post-op nutrition visit for diet advancement per MD.

## 2014-07-06 ENCOUNTER — Other Ambulatory Visit: Payer: Self-pay

## 2014-07-06 VITALS — BP 118/78 | HR 85 | Ht 72.0 in | Wt 270.8 lb

## 2014-07-06 DIAGNOSIS — E119 Type 2 diabetes mellitus without complications: Secondary | ICD-10-CM

## 2014-07-06 LAB — POCT GLYCOSYLATED HEMOGLOBIN (HGB A1C): Hemoglobin A1C: 8

## 2014-07-06 NOTE — Patient Outreach (Signed)
[facility]) Care Management   07/06/2014  Eric Juarez [birth year] [id number]  Eric Juarez is an 59 y.o. male.   Member seen for follow up office visit for Link to Wellness program for self management of Type 2 diabetes  Subjective: Member states that he has been doing well since his bypass surgery on 06/14/14.  States that he has been able to progress his diet as directed and he has not had any nausea or problems after eating.  States that his blood sugars have been slowly progressing lower.  States he is only taking his Novolog insulin before meals now.  States he is to see Dr. Buddy Duty on 08/12/14.  States he would like to get to the point where he does not need any insulin but he knows that he will most likely need to be on some type of medication for his diabetes.  States he has been walking 2 miles a day and he plans to go to the Bariatric Exercise and Lifestyle Transformation  program later this month.   Objective:   Review of Systems  All other systems reviewed and are negative.   Physical Exam  Filed Vitals:   07/06/14 1058  BP: 118/78  Pulse: 85   Filed Weights   07/06/14 1058  Weight: 270 lb 12.8 oz (122.834 kg)  POCT HemoglobinA1C: 8  Current Medications:   Current Outpatient Prescriptions  Medication Sig Dispense Refill  . Calcium Citrate-Vitamin D 500-500 MG-UNIT CHEW Chew 1 each by mouth 3 (three) times daily.    . Cyanocobalamin (VITAMIN B-12) 1000 MCG SUBL Place 1 tablet under the tongue daily.    . insulin aspart (NOVOLOG) 100 UNIT/ML injection Inject 0-20 Units into the skin every 4 (four) hours. 10 mL 11  . pantoprazole (PROTONIX) 40 MG tablet Take 1 tablet (40 mg total) by mouth daily. 30 tablet 0  . Pediatric Multiple Vit-C-FA (FLINSTONES GUMMIES OMEGA-3 DHA PO) Take 1 tablet by mouth 2 (two) times daily.     . simvastatin (ZOCOR) 20 MG tablet Take 20 mg by mouth daily.    . enalapril (VASOTEC) 5 MG tablet Take 5 mg by mouth every evening.     .  Omega-3 Fatty Acids (OMEGA 3 PO) Take 2 capsules by mouth daily.    Marland Kitchen oxyCODONE (ROXICODONE) 5 MG/5ML solution Take 5-10 mLs (5-10 mg total) by mouth every 4 (four) hours as needed for moderate pain or severe pain. (Patient not taking: Reported on 07/06/2014)  0   No current facility-administered medications for this visit.    Functional Status:   In your present state of health, do you have any difficulty performing the following activities: 07/06/2014 06/14/2014  Hearing? N N  Vision? N N  Difficulty concentrating or making decisions? N N  Walking or climbing stairs? N N  Dressing or bathing? N N  Doing errands, shopping? N N    Fall/Depression Screening:    PHQ 2/9 Scores 07/06/2014  PHQ - 2 Score 0   THN CM Care Plan        Most Recent Value   Problem One    Care Plan Problem One  Elevated blood sugars related to dx of Type 2 DM as evidenced by Hemoglobin A1C of 8   Role Documenting the Problem One  Care Management Coordinator   [facility] CM Care Plan Problem One     Care Plan for Problem One  Active   Patient Has Long Term Goal?  Yes  THN Long Term Goal (31-90 days)  Member will see decrease of blood sugars as evidenced by decrease of hemoglobin A1C to 7 or below within the next 90 days   THN Long Term Goal Start Date  07/06/14   Interventions for Problem One Long Term Goal  Reviewed CHO counting and importance of following post bariatric surgery diet, Reinforced importance of exercise for glycemic control, instructed to keep appointment with Dr.Kerr on 08/12/14 , Reviewed  s/s of hypoglycemia and actions to take   Problem One Short Term Goals    Short Term Goal #1    Short Term Goal #2    Short Term Goal #3    Short Term Goal #4    Short Tern Goal #5    Problem Two    THN CM Care Plan Problem Two    Problem Two Short Term Goals    Short Term Goal #1    Short Term Goal #2    Short Term Goal #3    Short Term Goal #4    Short Term Goal #5    Problem Three    THN CM Care Plan  Problem Three    Problem Three Short Term Goals    Short Term Goal #1    Short Term Goal #2    Short Term Goal #3    Short Term Goal #4    Short Term Goal #5       Assessment:  Member seen for Link to Wellness program for self management of Type 2 DM, Member has gastric bypass surgery on 06/14/14.  He is no longer taking Humulin U-500 insulin and is only taking Novolog insulin at meal times per sliding scale, Reports that blood sugars are ranging from 180 275 since surgery with readings trending down, Tolerating post bariatric diet well with no c/o nausea or pain after eating, Member has been walking 2 miles a day and he will enroll in the Bariatric Exercise and Lifestyle Transformation program  program at the end of May, POCT hemoglobin A1C 8 today,   Plan:  Plan to continue to follow post bariatric diet Plan to check blood sugars 2-3 times a day- before meals and 2 hours after eating.  Goals are 80-   130 before meals and less than 180 after meals. Plan walk daily for 2 miles a day.  Plan to go to Bariatric Exercise and Lifestyle Transformation program assessment on 07/23/14 Plan to keep appt with Dr. Buddy Duty  08/12/14 Return to Link to Wellness on September 28, 2014 at [place], [facility] Care Management Coordinator-Link to [place] Management [phone]

## 2014-08-10 ENCOUNTER — Encounter: Payer: 59 | Attending: General Surgery | Admitting: Dietician

## 2014-08-10 ENCOUNTER — Encounter: Payer: Self-pay | Admitting: Dietician

## 2014-08-10 DIAGNOSIS — Z6841 Body Mass Index (BMI) 40.0 and over, adult: Secondary | ICD-10-CM | POA: Diagnosis not present

## 2014-08-10 DIAGNOSIS — Z713 Dietary counseling and surveillance: Secondary | ICD-10-CM | POA: Insufficient documentation

## 2014-08-10 NOTE — Patient Instructions (Signed)
Goals:  Follow Phase 3B: High Protein + Non-Starchy Vegetables  Eat 3-6 small meals/snacks, every 3-5 hrs  Increase lean protein foods to meet 80g goal  Increase fluid intake to 64oz +  Avoid drinking 15 minutes before, during and 30 minutes after eating  Aim for >30 min of physical activity daily

## 2014-08-10 NOTE — Progress Notes (Signed)
  Follow-up visit:  8 Weeks Post-Operative RYGB Surgery  Medical Nutrition Therapy:  Appt start time: 0940 end time:  1010.  Primary concerns today: Post-operative Bariatric Surgery Nutrition Management. Returns with a 22 lbs weight loss. Started the BELT program and [facility]. Tolerating foods well for the most part except for BBQ rib recently. Hasn't gotten sick once from eating.   Using MyFitness Pal to track food.  Surgery date:  06/14/14 Surgery type: RYGB Start weight at [facility]: 308 lbs on 05/03/14 Weight today: 253.5 lbs  Weight change: 22 lbs Total weight loss: 52.5  TANITA  BODY COMP RESULTS  05/31/14 06/29/14 08/10/14   BMI (kg/m^2) 41.5 37.4 34.4   Fat Mass (lbs) 122 107.0 89.0   Fat Free Mass (lbs) 184 168.5 164.5   Total Body Water (lbs) 134.5 123.5 120.5    Preferred Learning Style:   No preference indicated   Learning Readiness:   Ready  24-hr recall: B (AM): decaf coffee with almond milk and premier protein (30 g)  Snk (AM): if he is hungry will have yogurt or piece of cheese/meat (0-9 g)  L (PM): 1 cup - 1.5 cups of chili (15 g) Snk (PM): yogurt or protein shake or meat (7-30 g) D (PM): 3-4 oz shrimp, fish, chicken with green beans, spinach, carrots (21-28 g) Snk (PM): might have another protein shake  Fluid intake: 11-22 protein shake, 16 oz decaf coffee, 34 oz water with Mio drops, 12 oz diet cranberry (73-84 oz) Estimated total protein intake: 73-119 g  Medications: see list  Supplementation: taking  CBG monitoring: 3 x day  Average CBG per patient: 150-200 mg/dl Last patient reported A1c: 8.0% a couple weeks ago, will get another Hcg A1c this Thursday  Using straws: No Drinking while eating: No Hair loss: No Carbonated beverages: No N/V/D/C: some constipation (getting better) Dumping syndrome: None  Recent physical activity:  BELT program (just started) also [facility], averaging close to 0,000 steps per day    Progress Towards Goal(s):  In progress.  Handouts given during visit include:  Phase 3B High Protein + Non Starchy Vegetables   Nutritional Diagnosis:  Kimball-3.3 Overweight/obesity related to past poor dietary habits and physical inactivity as evidenced by patient w/ recent RYGB surgery following dietary guidelines for continued weight loss.    Intervention:  Nutrition education/diet advancment Goals:  Follow Phase 3B: High Protein + Non-Starchy Vegetables  Eat 3-6 small meals/snacks, every 3-5 hrs  Increase lean protein foods to meet 80g goal  Increase fluid intake to 64oz +  Avoid drinking 15 minutes before, during and 30 minutes after eating  Aim for >30 min of physical activity daily  Teaching Method Utilized:  Visual Auditory Hands on  Barriers to learning/adherence to lifestyle change: none  Demonstrated degree of understanding via:  Teach Back   Monitoring/Evaluation:  Dietary intake, exercise, and body weight. Follow up in 1 months for 3 month post-op visit.

## 2014-09-28 ENCOUNTER — Other Ambulatory Visit: Payer: Self-pay

## 2014-09-28 VITALS — BP 128/76 | HR 74 | Resp 16 | Ht 72.0 in | Wt 239.6 lb

## 2014-09-28 DIAGNOSIS — E119 Type 2 diabetes mellitus without complications: Secondary | ICD-10-CM

## 2014-09-28 NOTE — Patient Instructions (Addendum)
1. Plan to continue to follow post bariatric low CHO diet 2. Plan to check blood sugars 2-3 times a day- before meals and 2 hours after eating.  Goals are 80-130 before meals and less than 180 after meals. 3. Plan walk 2 days a week for 2 miles on days you do not go to exercise program.  Plan to go to Bariatric Exercise Lifestyle Transformation program    three days a week 4. Plan to keep appointment with Dr. Buddy Duty  in September 5. Return to Link to Wellness on December 27, 2014 at [facility] [place]

## 2014-09-28 NOTE — Patient Outreach (Signed)
[place] [facility]) Care Management   09/28/2014  AADIN GAUT [birth year] [id number]  Eric Juarez is an 59 y.o. male.   Member seen for follow up office visit for Link to Wellness program for self management of Type 2 diabetes  Subjective: Member states that he has continued to lose weight since his surgery and he is down 71 lbs now.  States he is taking his meal time insulin usually 12 units at breakfast and 9 units at lunch and dinner.  States he saw Dr.Kerr in May and his hemoglobin A1C has gone down to 7.2.  States he is to see him again in September.  States he is going the Bariatric Exercise Lifestyle Transformation program three days a week.    Objective:   Review of Systems  All other systems reviewed and are negative. Reviewed glucometer 14 day average-210  Before meal ranges AM ranges 168-259 Lunch ranges 139-229 Dinner ranges 101-232  Physical Exam  Today's Vitals   09/28/14 0915  BP: 128/76  Pulse: 74  Resp: 16  Height: 1.829 m (6')  Weight: 239 lb 9.6 oz (108.682 kg)  SpO2: 98%    Current Medications:   Current Outpatient Prescriptions  Medication Sig Dispense Refill  . Calcium Citrate-Vitamin D 500-500 MG-UNIT CHEW Chew 1 each by mouth 3 (three) times daily.    . Cyanocobalamin (VITAMIN B-12) 1000 MCG SUBL Place 1 tablet under the tongue daily.    . insulin aspart (NOVOLOG) 100 UNIT/ML injection Inject 0-20 Units into the skin every 4 (four) hours. (Patient taking differently: Inject 0-20 Units into the skin 3 (three) times daily with meals. ) 10 mL 11  . Pediatric Multiple Vit-C-FA (FLINSTONES GUMMIES OMEGA-3 DHA PO) Take 1 tablet by mouth 2 (two) times daily.     . enalapril (VASOTEC) 5 MG tablet Take 5 mg by mouth every evening.     . Omega-3 Fatty Acids (OMEGA 3 PO) Take 2 capsules by mouth daily.    Marland Kitchen oxyCODONE (ROXICODONE) 5 MG/5ML solution Take 5-10 mLs (5-10 mg total) by mouth every 4 (four) hours as needed for moderate pain or severe pain.  (Patient not taking: Reported on 07/06/2014)  0  . pantoprazole (PROTONIX) 40 MG tablet Take 1 tablet (40 mg total) by mouth daily. (Patient not taking: Reported on 09/28/2014) 30 tablet 0  . simvastatin (ZOCOR) 20 MG tablet Take 20 mg by mouth daily.     No current facility-administered medications for this visit.    Functional Status:   In your present state of health, do you have any difficulty performing the following activities: 09/28/2014 07/06/2014  Hearing? N N  Vision? N N  Difficulty concentrating or making decisions? N N  Walking or climbing stairs? N N  Dressing or bathing? N N  Doing errands, shopping? N N    Fall/Depression Screening:    PHQ 2/9 Scores 09/28/2014 08/10/2014 07/06/2014  PHQ - 2 Score 0 0 0   THN CM Care Plan Problem One        Patient Outreach from 09/28/2014 in [place] Problem One  Elevated blood sugars related to dx of Type 2 DM as evidenced by Hemoglobin A1C of 8   Care Plan for Problem One  Active   THN Long Term Goal (31-90 days)  Member will see decrease of blood sugars as evidenced by decrease of hemoglobin A1C to 7 or below within the next 90 days   [place]  Goal Start Date  09/28/14 [Last hemoglobin A1C 7.2 making progress to  7 goal]   Interventions for Problem One Long Term Goal  Reviewed CHO counting and importance of following post bariatric surgery diet, Reinforced importance of exercise for glycemic control,Discussed with member that he might need basal insulin to help improve his morning CBGs and to discuss with Dr.Kerr at his September appointment.  Reviewed  s/s of hypoglycemia and actions to take      Assessment:   Member seen for follow up office visit for Link to Wellness program for self management of Type 2 diabetes.  Member continues to lose weight since bariatric surgery 06/14/14.  His weight is down 71 lbs since January.  He continues to have elevated blood sugars in the AM.  He is taking bolus mealtime  insulin.  He may need basal insulin coverage and he will discuss with endocrinologist at 11/18/14 visit.  Plan:   Plan to continue to follow post bariatric low CHO diet Plan to check blood sugars 2-3 times a day- before meals and 2 hours after eating.  Goals are 80-130 before meals and less than 180 after meals. Plan walk 2 days a week for 2 miles on days you do not go to exercise program.  Plan to go to Bariatric Exercise Lifestyle Transformation program  three days a week Plan to keep appointment with Dr. Buddy Duty  in September Return to Link to Wellness on December 27, 2014 at [facility] at [place]

## 2014-09-29 ENCOUNTER — Encounter: Payer: Self-pay | Admitting: Dietician

## 2014-09-29 ENCOUNTER — Encounter: Payer: 59 | Attending: General Surgery | Admitting: Dietician

## 2014-09-29 DIAGNOSIS — Z713 Dietary counseling and surveillance: Secondary | ICD-10-CM | POA: Diagnosis not present

## 2014-09-29 DIAGNOSIS — Z6841 Body Mass Index (BMI) 40.0 and over, adult: Secondary | ICD-10-CM | POA: Insufficient documentation

## 2014-09-29 NOTE — Patient Instructions (Addendum)
Goals:  Follow Phase 3B: High Protein + Non-Starchy Vegetables  Eat 3-6 small meals/snacks, every 3-5 hrs  Increase lean protein foods to meet 80g goal  Increase fluid intake to 64oz +  Avoid drinking 15 minutes before, during and 30 minutes after eating  Aim for >30 min of physical activity daily  Eat protein foods first, then vegetables. If you have room add some fruit occassionally at meals  Avoid extra carbs at snacks  Surgery date:  06/14/14 Surgery type: RYGB Start weight at [facility]: 308 lbs on 05/03/14 Weight today: 240.5 lbs  Weight change: 13 lbs, 21 lbs fat mass loss Total weight loss: 65 lbs Weight loss goal: first goal is 225 lbs, second goal is under 200 lbs, third goal is 185 lbs  TANITA  BODY COMP RESULTS  05/31/14 06/29/14 08/10/14 09/29/14   BMI (kg/m^2) 41.5 37.4 34.4 31.7   Fat Mass (lbs) 122 107.0 89.0 68.0   Fat Free Mass (lbs) 184 168.5 164.5 172.5   Total Body Water (lbs) 134.5 123.5 120.5 126.5

## 2014-09-29 NOTE — Progress Notes (Signed)
Follow-up visit:  14 Weeks Post-Operative RYGB Surgery  Medical Nutrition Therapy:  Appt start time: 1130 end time: 1200 .  Primary concerns today: Post-operative Bariatric Surgery Nutrition Management. Returns with a 13 lbs weight loss and 21 lbs fat mass loss. Still doing the BELT program and has increased exercise and weights.  Tolerating foods well for the most part. Had gotten sick after eating tomato bisque. Has tried no sugar added ice cream sandwich and ice cream and did not have problems tolerating it. Has added almonds and sometimes pretzels.   No longer using MyFitness Pal. Has constipation if he doesn't have salad.  Surgery date:  06/14/14 Surgery type: RYGB Start weight at [facility]: 308 lbs on 05/03/14 Weight today: 240.5 lbs  Weight change: 13 lbs, 21 lbs fat mass loss Total weight loss: 65 lbs Weight loss goal: first goal is 225 lbs, second goal is under 200 lbs, third goal is 185 lbs  TANITA  BODY COMP RESULTS  05/31/14 06/29/14 08/10/14 09/29/14   BMI (kg/m^2) 41.5 37.4 34.4 31.7   Fat Mass (lbs) 122 107.0 89.0 68.0   Fat Free Mass (lbs) 184 168.5 164.5 172.5   Total Body Water (lbs) 134.5 123.5 120.5 126.5    Preferred Learning Style:   No preference indicated   Learning Readiness:   Ready  24-hr recall: B (AM): decaf coffee with almond milk and premier protein (30 g)  Snk (AM): if he is hungry will have yogurt or piece of cheese/meat or egg (6-12 g)  L (PM): 2-3 oz lunch meat or meat with salad (14-21 g) Snk (PM): yogurt or almonds (7-12 g) D (PM): 3-4 oz shrimp, fish, chicken, pork chop with salad, green beans, spinach, or carrots (21-28 g) Snk (PM): might have beef jerky, yogurt, sugar free pudding, or sugar free jello (0-12 g)  Fluid intake: 11 protein shake, 16 oz decaf coffee, 34 oz water with Mio drops, 12 oz diet cranberry (73 oz) Estimated total protein intake: 78-115 g  Medications: see list  Supplementation: taking  CBG monitoring: 2 x day   Average CBG per patient: 125-225 mg/dl Last patient reported A1c: 7.2% at in June 2016  Using straws: No Drinking while eating: No Hair loss: No Carbonated beverages: No N/V/D/C: vomited after tomato bisque and might feel sick if he eats too quickly, some constipation (getting better with salad) Dumping syndrome: None  Recent physical activity:  BELT program 3 x week averaging close to 8,000 steps per day (stopped measuring)  Progress Towards Goal(s):  In progress.  Handouts given during visit include:  Phase 3B High Protein + Non Starchy Vegetables   Nutritional Diagnosis:  [place]-3.3 Overweight/obesity related to past poor dietary habits and physical inactivity as evidenced by patient w/ recent RYGB surgery following dietary guidelines for continued weight loss.    Intervention:  Nutrition education/diet advancment Goals:  Follow Phase 3B: High Protein + Non-Starchy Vegetables  Eat 3-6 small meals/snacks, every 3-5 hrs  Increase lean protein foods to meet 80g goal  Increase fluid intake to 64oz +  Avoid drinking 15 minutes before, during and 30 minutes after eating  Aim for >30 min of physical activity daily  Eat protein foods first, then vegetables. If you have room add some fruit occassionally at meals  Avoid extra carbs at snacks  Teaching Method Utilized:  Visual Auditory Hands on  Barriers to learning/adherence to lifestyle change: none  Demonstrated degree of understanding via:  Teach Back   Monitoring/Evaluation:  Dietary intake, exercise,  and body weight. Follow up in 3 months for 6 month post-op visit.

## 2014-12-20 ENCOUNTER — Encounter: Payer: 59 | Attending: General Surgery | Admitting: Dietician

## 2014-12-20 ENCOUNTER — Encounter: Payer: Self-pay | Admitting: Dietician

## 2014-12-20 DIAGNOSIS — Z6829 Body mass index (BMI) 29.0-29.9, adult: Secondary | ICD-10-CM | POA: Diagnosis not present

## 2014-12-20 DIAGNOSIS — Z713 Dietary counseling and surveillance: Secondary | ICD-10-CM | POA: Diagnosis not present

## 2014-12-20 NOTE — Patient Instructions (Addendum)
Goals:  Follow Phase 3B: High Protein + Non-Starchy Vegetables  Eat 3-6 small meals/snacks, every 3-5 hrs  Increase lean protein foods to meet 80g goal  Increase fluid intake to 64oz +  Avoid drinking 15 minutes before, during and 30 minutes after eating  Aim for >30 min of physical activity daily  Eat protein foods first, then vegetables. If you have room add some fruit/carbs occassionally at meals  Avoid extra carbs at snacks if weight loss slows  Surgery date:  06/14/14 Surgery type: RYGB Start weight at [facility]: 308 lbs on 05/03/14 Weight today: 218.5 lbs  Weight change: 22 lbs Total weight loss: 87 lbs Weight loss goal: first goal is 225 lbs, second goal is under 200 lbs  TANITA  BODY COMP RESULTS  05/31/14 06/29/14 08/10/14 09/29/14 12/20/14   BMI (kg/m^2) 41.5 37.4 34.4 31.7 30.5   Fat Mass (lbs) 122 107.0 89.0 68.0 63.0   Fat Free Mass (lbs) 184 168.5 164.5 172.5 155.5   Total Body Water (lbs) 134.5 123.5 120.5 126.5 114.0

## 2014-12-20 NOTE — Progress Notes (Signed)
Follow-up visit:  6 Months Post-Operative RYGB Surgery  Medical Nutrition Therapy:  Appt start time: 955 end time: 7017 .  Primary concerns today: Post-operative Bariatric Surgery Nutrition Management. Returns with a 22 lbs weight loss. States he has been "cheating some" but his body does not tolerate fat well. Still having some pretzels. Had dumping syndrome after having some Halloween candy. Had some ice cream sandwiches and weight watchers fudgsicle which he tolerated (eats after a meal). Still fighting constipation and takes Miralax sometimes.   Completed the BELT program. Walks about 8 miles per day when he works at the [employer]. Also doing yard work. Overall feeling good.   Surgery date:  06/14/14 Surgery type: RYGB Start weight at [facility]: 308 lbs on 05/03/14 Weight today: 218.5 lbs  Weight change: 22 lbs Total weight loss: 87 lbs Weight loss goal: first goal is 225 lbs (achieved!), second goal is under 200 lbs   TANITA  BODY COMP RESULTS  05/31/14 06/29/14 08/10/14 09/29/14 12/20/14   BMI (kg/m^2) 41.5 37.4 34.4 31.7 30.5   Fat Mass (lbs) 122 107.0 89.0 68.0 63.0   Fat Free Mass (lbs) 184 168.5 164.5 172.5 155.5   Total Body Water (lbs) 134.5 123.5 120.5 126.5 114.0    Preferred Learning Style:   No preference indicated   Learning Readiness:   Ready  24-hr recall: B (AM): decaf coffee with almond milk and premier protein (30 g)  Snk (AM): if he is hungry will have yogurt or piece of cheese/meat or egg or pretzel rod (0-12 g)  L (PM): 3 oz meat with cheese, egg, and yogurt or meat with salad (28 g) Snk (PM): yogurt or almonds (7-12 g) D (PM): 3-4 oz shrimp, fish, chicken, pork chop with salad, green beans, spinach, or carrots (21-28 g) Snk (PM): might have beef jerky, yogurt, sugar free pudding, or sugar free jello (0-12 g)  Fluid intake: 11 oz  protein shake, 16 oz decaf coffee, 34 oz water with Mio drops, 12 oz diet cranberry (73 oz) Estimated total protein intake: 78-115  g  Medications: see list  Supplementation: taking  CBG monitoring: 2 x day  Average CBG per patient: 165 mg/dl Last patient reported A1c: 7.8% in October 2016 (per patient)  Using straws: No Drinking while eating: No Hair loss: getting a little thinner  Carbonated beverages: No N/V/D/C: some constipation taking Miralax as needed Dumping syndrome: one time after 5 candies  Recent physical activity:  Getting a lot of activity at home and at job 3 per week and [facility] 20 minutes of weight lifting and 30 minutes cardio  Progress Towards Goal(s):  In progress.  Handouts given during visit include:  none   Nutritional Diagnosis:  [place]-3.3 Overweight/obesity related to past poor dietary habits and physical inactivity as evidenced by patient w/ recent RYGB surgery following dietary guidelines for continued weight loss.    Intervention:  Nutrition education/diet reinforcement Goals:  Follow Phase 3B: High Protein + Non-Starchy Vegetables  Eat 3-6 small meals/snacks, every 3-5 hrs  Increase lean protein foods to meet 80g goal  Increase fluid intake to 64oz +  Avoid drinking 15 minutes before, during and 30 minutes after eating  Aim for >30 min of physical activity daily  Eat protein foods first, then vegetables. If you have room add some fruit/carbs occassionally at meals  Avoid extra carbs at snacks if weight loss slows  Teaching Method Utilized:  Visual Auditory Hands on  Barriers to learning/adherence to lifestyle change: none  Demonstrated degree of understanding via:  Teach Back   Monitoring/Evaluation:  Dietary intake, exercise, and body weight. Follow up in 3 months for 9 month post-op visit.

## 2014-12-27 ENCOUNTER — Ambulatory Visit: Payer: 59

## 2015-01-03 ENCOUNTER — Other Ambulatory Visit: Payer: Self-pay

## 2015-01-03 VITALS — BP 112/68 | HR 76 | Resp 14 | Ht 72.0 in | Wt 218.2 lb

## 2015-01-03 DIAGNOSIS — Z794 Long term (current) use of insulin: Principal | ICD-10-CM

## 2015-01-03 DIAGNOSIS — E119 Type 2 diabetes mellitus without complications: Secondary | ICD-10-CM

## 2015-01-03 NOTE — Patient Outreach (Signed)
[place] [facility]) Care Management   01/03/2015  Eric Juarez Apr 29, 1955 [id number]  Eric Juarez is an 59 y.o. male.   Member seen for follow up office visit for Link to Wellness program for self management of Type 2 diabetes  Subjective: Member states he continues to lose weight.  States he is trying to follow his post bariatric surgery diet but he occasionally will eat some pretzels or a few pieces of Halloween candy.  States he completed the bariatric exercise program.  States he is trying to go to the gym 3 times a week and he is very active at his new job at a [employer].  States that he saw Dr.Kerr earlier this month and he started him on Jardiance.  States that he has been able to cut his meal time insulin to 9-12 units a meal.  Objective:   Review of Systems  All other systems reviewed and are negative. Reviewed glucometer 14 day average-173  Physical Exam  Today's Vitals   01/03/15 1015  BP: 112/68  Pulse: 76  Resp: 14  Height: 1.829 m (6')  Weight: 218 lb 3.2 oz (98.975 kg)  SpO2: 95%  PainSc: 0-No pain    Current Medications:   Current Outpatient Prescriptions  Medication Sig Dispense Refill  . Calcium Citrate-Vitamin D 500-500 MG-UNIT CHEW Chew 1 each by mouth 3 (three) times daily.    . Cyanocobalamin (VITAMIN B-12) 1000 MCG SUBL Place 1 tablet under the tongue daily.    . empagliflozin (JARDIANCE) 10 MG TABS tablet Take 10 mg by mouth daily.    . insulin aspart (NOVOLOG) 100 UNIT/ML injection Inject 0-20 Units into the skin every 4 (four) hours. (Patient taking differently: Inject 0-20 Units into the skin 3 (three) times daily with meals. ) 10 mL 11  . Pediatric Multiple Vit-C-FA (FLINSTONES GUMMIES OMEGA-3 DHA PO) Take 1 tablet by mouth 2 (two) times daily.     . enalapril (VASOTEC) 5 MG tablet Take 5 mg by mouth every evening.     . Omega-3 Fatty Acids (OMEGA 3 PO) Take 2 capsules by mouth daily.    Marland Kitchen oxyCODONE (ROXICODONE) 5 MG/5ML solution Take 5-10  mLs (5-10 mg total) by mouth every 4 (four) hours as needed for moderate pain or severe pain. (Patient not taking: Reported on 07/06/2014)  0  . pantoprazole (PROTONIX) 40 MG tablet Take 1 tablet (40 mg total) by mouth daily. (Patient not taking: Reported on 09/28/2014) 30 tablet 0  . simvastatin (ZOCOR) 20 MG tablet Take 20 mg by mouth daily.     No current facility-administered medications for this visit.    Functional Status:   In your present state of health, do you have any difficulty performing the following activities: 01/03/2015 09/28/2014  Hearing? N N  Vision? N N  Difficulty concentrating or making decisions? N N  Walking or climbing stairs? N N  Dressing or bathing? N N  Doing errands, shopping? N N    Fall/Depression Screening:    PHQ 2/9 Scores 01/03/2015 12/20/2014 09/28/2014 08/10/2014 07/06/2014  PHQ - 2 Score 0 0 0 0 0    Assessment:   Member seen for follow up office visit for Link to Wellness program for self management of Type 2 diabetes.  Member continues to not be at diabetes goal of hemoglobin A1C of 7 with last reading 7.7. Member has started [place] and has seen decreased fasting blood sugars since starting.  He continues to lose weight and has  lost 92 lbs since surgery. He reports exercising regularly.  Plan:   1. Plan to continue to follow post bariatric diet 2. Plan to check blood sugars 2-3 times a day- before meals and 2 hours after eating.  Goals are 80-130 before meals and less than 180 after meals. 3. Plan to exercise 3 days a week at gym and walk on other days 4. Plan to keep appointment with Dr. Buddy Duty  March 18, 2015 5. Return to Link to Wellness on 04/25/15 at 10 AM   [facility] CM Care Plan Problem One        Most Recent Value   Care Plan Problem One  Elevated blood sugars related to dx of Type 2 DM as evidenced by Hemoglobin A1C of 8   Role Documenting the Problem One  Care Management [name] for Problem One  Active   THN Long Term Goal  (31-90 days)  Member will see decrease of blood sugars as evidenced by decrease of hemoglobin A1C to 7 or below within the next 90 days   THN Long Term Goal Start Date  01/03/15 [Last hemoglobin A1C 7.7 started on new medication ]   Interventions for Problem One Long Term Goal  Reviewed CHO counting and importance of following post bariatric surgery diet, Reinforced importance of exercise for glycemic control, Instructed on use of Jardiance and possible side effects,  Reviewed  s/s of hypoglycemia and actions to take    [place], [facility] Care Management Coordinator-Link to [place] Management [phone]

## 2015-01-03 NOTE — Patient Instructions (Signed)
1. Plan to continue to follow post bariatric diet 2. Plan to check blood sugars 2-3 times a day- before meals and 2 hours after eating.  Goals are 80-130 before meals and less than 180 after meals. 3. Plan to exercise 3 days a week at gym and walk on other days 4. Plan to keep appointment with Dr. Buddy Duty  March 18, 2015 5. Return to Link to Wellness on 04/25/15 at [place]

## 2015-03-21 ENCOUNTER — Ambulatory Visit: Payer: 59 | Admitting: Dietician

## 2015-04-18 ENCOUNTER — Ambulatory Visit: Payer: 59

## 2015-04-25 ENCOUNTER — Other Ambulatory Visit: Payer: Self-pay

## 2015-04-25 VITALS — BP 120/70 | HR 62 | Resp 16 | Ht 72.0 in | Wt 204.6 lb

## 2015-04-25 DIAGNOSIS — Z794 Long term (current) use of insulin: Principal | ICD-10-CM

## 2015-04-25 DIAGNOSIS — E119 Type 2 diabetes mellitus without complications: Secondary | ICD-10-CM

## 2015-04-25 NOTE — Patient Instructions (Signed)
1. Plan to continue to follow post bariatric diet 2. Plan to check blood sugars 2-3 times a day- before meals and 2 hours after eating. Try to check blood sugar before taking bedtime insulin.  Goals are 80-130 before meals and less than 180 after meals. 3. Plan to exercise 3 days a week at gym and walk on other days 4. Plan to keep appointment with Dr. Buddy Duty  May 02, 2015       5.   Return to Link to Wellness on 07/26/15 at [place]

## 2015-04-25 NOTE — Patient Outreach (Signed)
[facility]) Care Management   04/25/2015  Eric Juarez 12/25/1955 [id number]  Eric Juarez is an 60 y.o. male.   Member seen for follow up office visit for Link to Wellness program for self management of Type 2 diabetes  Subjective:  Member states he is feeling good and he is still losing weight.  States that he is still having higher blood sugars in the AM.  States his sugars have been low since starting Jardiance.  States he has been checking his blood sugars less and he has not been checking his blood sugars before taking his bedtime insulin.  Denies any hypoglycemia.  States he has gotten into a good pattern with his eating and has not had any nausea or dumping syndrome.  States he is going to the gym 3 times a week and he is very active at his work.  States he is doing more weight training also.  Objective:   Review of Systems  All other systems reviewed and are negative. Reviewed glucometer 14 day average-159  Physical Exam  Today's Vitals   04/25/15 1008  BP: 120/70  Pulse: 62  Resp: 16  Height: 1.829 m (6')  Weight: 204 lb 9.6 oz (92.806 kg)  SpO2: 98%  PainSc: 0-No pain    Current Medications:   Current Outpatient Prescriptions  Medication Sig Dispense Refill  . Calcium Citrate-Vitamin D 500-500 MG-UNIT CHEW Chew 1 each by mouth 3 (three) times daily.    . Cyanocobalamin (VITAMIN B-12) 1000 MCG SUBL Place 1 tablet under the tongue daily.    . empagliflozin (JARDIANCE) 10 MG TABS tablet Take 10 mg by mouth daily.    . insulin aspart (NOVOLOG) 100 UNIT/ML injection Inject 0-20 Units into the skin every 4 (four) hours. (Patient taking differently: Inject 0-20 Units into the skin 3 (three) times daily with meals. ) 10 mL 11  . Pediatric Multiple Vit-C-FA (FLINSTONES GUMMIES OMEGA-3 DHA PO) Take 1 tablet by mouth 2 (two) times daily.     . enalapril (VASOTEC) 5 MG tablet Take 5 mg by mouth every evening. Reported on 04/25/2015    . Omega-3 Fatty Acids (OMEGA 3  PO) Take 2 capsules by mouth daily. Reported on 04/25/2015    . oxyCODONE (ROXICODONE) 5 MG/5ML solution Take 5-10 mLs (5-10 mg total) by mouth every 4 (four) hours as needed for moderate pain or severe pain. (Patient not taking: Reported on 07/06/2014)  0  . pantoprazole (PROTONIX) 40 MG tablet Take 1 tablet (40 mg total) by mouth daily. (Patient not taking: Reported on 09/28/2014) 30 tablet 0  . simvastatin (ZOCOR) 20 MG tablet Take 20 mg by mouth daily. Reported on 04/25/2015     No current facility-administered medications for this visit.    Functional Status:   In your present state of health, do you have any difficulty performing the following activities: 04/25/2015 01/03/2015  Hearing? N N  Vision? N N  Difficulty concentrating or making decisions? N N  Walking or climbing stairs? N N  Dressing or bathing? N N  Doing errands, shopping? N N    Fall/Depression Screening:    PHQ 2/9 Scores 04/25/2015 01/03/2015 12/20/2014 09/28/2014 08/10/2014 07/06/2014  PHQ - 2 Score 0 0 0 0 0 0   Assessment:   Member seen for follow up office visit for Link to Wellness program for self management of Type 2 diabetes.   Member continues to not be at diabetes goal of hemoglobin A1C of 7 with last  reading 7.7. Member has started [place] and has seen decreased fasting blood sugars since starting. He continues to lose weight and has lost 105 lbs since surgery. He reports exercising regularly and following post bariatric diet.  Member is to see Dr. Buddy Duty on 05/02/15  Plan:   Plan to continue to follow post bariatric diet Plan to check blood sugars 2-3 times a day- before meals and 2 hours after eating. Try to check blood sugar before taking bedtime insulin.  Goals are 80-130 before meals and less than 180 after meals. Plan to exercise 3 days a week at gym and walk on other days Plan to keep appointment with Dr. Buddy Duty  May 02, 2015       Plan to return to Link to Wellness on 07/26/15 at [place]  Problem One        Most Recent Value   Care Plan Problem One  Elevated blood sugars related to dx of Type 2 DM as evidenced by Hemoglobin A1C of 8   Role Documenting the Problem One  Care Management [place] for Problem One  Active   THN Long Term Goal (31-90 days)  Member will see decrease of blood sugars as evidenced by decrease of hemoglobin A1C to 7 or below within the next 90 days   THN Long Term Goal Start Date  04/25/15 [Last hemoglobin A1C 7.7 to have recheced 05/02/15]   Interventions for Problem One Long Term Goal  Reviewed CHO counting and importance of following post bariatric surgery diet, Reinforced importance of exercise for glycemic control, Instructed member to take his blood sugars before taking his bedtime insulin,  Instructed to keep his appointment with Dr. Buddy Duty on 05/02/15, Instructed to review his blood sugars and medications,  Reviewed  s/s of hypoglycemia and actions to take    [place], [facility] Care Management Coordinator-Link to [place] Management [phone]

## 2015-05-02 DIAGNOSIS — E1142 Type 2 diabetes mellitus with diabetic polyneuropathy: Secondary | ICD-10-CM | POA: Diagnosis not present

## 2015-05-02 DIAGNOSIS — Z794 Long term (current) use of insulin: Secondary | ICD-10-CM | POA: Diagnosis not present

## 2015-05-02 DIAGNOSIS — Z9884 Bariatric surgery status: Secondary | ICD-10-CM | POA: Diagnosis not present

## 2015-05-02 MED FILL — CONTOUR NEXT STRIPS: 20 days supply | Qty: 100 | Fill #2

## 2015-05-02 MED FILL — TRESIBA FLEXTOUCH 100 UNITS: 100 | 45 days supply | Qty: 15 | Fill #0

## 2015-05-03 MED FILL — UNIFINE PENTIPS 8MM 31G: 31G X 8 MM | 90 days supply | Qty: 100 | Fill #0

## 2015-05-05 DIAGNOSIS — E1142 Type 2 diabetes mellitus with diabetic polyneuropathy: Secondary | ICD-10-CM | POA: Diagnosis not present

## 2015-05-05 DIAGNOSIS — Z794 Long term (current) use of insulin: Secondary | ICD-10-CM | POA: Diagnosis not present

## 2015-05-10 ENCOUNTER — Encounter: Payer: Self-pay | Admitting: Dietician

## 2015-05-10 ENCOUNTER — Encounter: Payer: 59 | Attending: General Surgery | Admitting: Dietician

## 2015-05-10 DIAGNOSIS — Z713 Dietary counseling and surveillance: Secondary | ICD-10-CM | POA: Diagnosis not present

## 2015-05-10 NOTE — Patient Instructions (Addendum)
Goals:  Follow Phase 3B: High Protein + Non-Starchy Vegetables  Eat 3-6 small meals/snacks, every 3-5 hrs  Increase lean protein foods to meet 80g goal  Increase fluid intake to 64oz +  Avoid drinking 15 minutes before, during and 30 minutes after eating  Aim for >30 min of physical activity daily  Eat protein foods first, then vegetables. If you have room add some fruit/carbs occassionally at meals  Avoid extra carbs at snacks if weight loss slows  Surgery date:  06/14/14 Surgery type: RYGB [facility] weight at [facility]: 308 lbs on 05/03/14 Weight today: 206.5 lbs Weight change: 12 lbs Total weight loss: 101.5 lbs Weight loss goal: under 200 lbs   *Fat % in NORMAL range at 22.7%    TANITA  BODY COMP RESULTS  05/31/14 06/29/14 08/10/14 09/29/14 12/20/14 05/10/15   BMI (kg/m^2) 41.5 37.4 34.4 31.7 30.5 28   Fat Mass (lbs) 122 107.0 89.0 68.0 63.0 47   Fat Free Mass (lbs) 184 168.5 164.5 172.5 155.5 159.5   Total Body Water (lbs) 134.5 123.5 120.5 126.5 114.0 117

## 2015-05-10 NOTE — Progress Notes (Signed)
Follow-up visit:  11 Months Post-Operative RYGB Surgery  Medical Nutrition Therapy:  Appt start time: [id number] end time: 1205  Primary concerns today: Post-operative Bariatric Surgery Nutrition Management. Returns having lost another 12 pounds. He states he was off insulin for a while and has since resumed insulin use. States that he would like to eventually discontinue the insulin again. Would like to get below 200 lbs but happy with weight loss. Meeting fluid and protein needs. Gets constipation if he does not get enough fluid. Has added in some "play foods" but states that he is careful about it.   Surgery date:  06/14/14 Surgery type: RYGB Start weight at [facility]: 308 lbs on 05/03/14 Weight today: 206.5 lbs Weight change: 12 lbs Total weight loss: 101.5 lbs Weight loss goal: under 200 lbs   TANITA  BODY COMP RESULTS  05/31/14 06/29/14 08/10/14 09/29/14 12/20/14 05/10/15   BMI (kg/m^2) 41.5 37.4 34.4 31.7 30.5 28   Fat Mass (lbs) 122 107.0 89.0 68.0 63.0 47   Fat Free Mass (lbs) 184 168.5 164.5 172.5 155.5 159.5   Total Body Water (lbs) 134.5 123.5 120.5 126.5 114.0 117    Preferred Learning Style:   No preference indicated   Learning Readiness:   Ready  24-hr recall: B (AM): decaf coffee with almond milk and premier protein (30 g)  Snk (AM): if he is hungry will have yogurt or piece of cheese/meat or egg or pretzel rod (0-12 g)  L (PM): 3 oz meat with cheese, egg, and yogurt or meat with salad (28 g) Snk (PM): yogurt or almonds (7-12 g) D (PM): 3-4 oz shrimp, fish, chicken, pork chop with salad, green beans, spinach, or carrots (21-28 g) Snk (PM): might have beef jerky, yogurt, sugar free pudding, or sugar free jello (0-12 g)  Fluid intake: 11 oz  protein shake, 16 oz decaf coffee, 34 oz water with Mio drops, 12 oz diet cranberry (73 oz) Estimated total protein intake: 78-115 g  Medications: see list  Supplementation: taking  CBG monitoring: 2 x day  Average CBG per patient: 140  mg/dl Last patient reported A1c: 7.8% in October 2016 (per patient)  Using straws: No Drinking while eating: No Hair loss: no longer having hair loss Carbonated beverages: No N/V/D/C: some constipation if he does not meet fluid needs Dumping syndrome: none recently (has gotten it from fat more than sugar in the past)  Recent physical activity:  Getting a lot of activity at home and at job 3 per week and [facility] for 20 minutes of weight lifting and 30 minutes cardio  Progress Towards Goal(s):  In progress.  Handouts given during visit include:  none   Nutritional Diagnosis:  [place]-3.3 Overweight/obesity related to past poor dietary habits and physical inactivity as evidenced by patient w/ recent RYGB surgery following dietary guidelines for continued weight loss.    Intervention:  Nutrition education/diet reinforcement Goals:  Follow Phase 3B: High Protein + Non-Starchy Vegetables  Eat 3-6 small meals/snacks, every 3-5 hrs  Increase lean protein foods to meet 80g goal  Increase fluid intake to 64oz +  Avoid drinking 15 minutes before, during and 30 minutes after eating  Aim for >30 min of physical activity daily  Eat protein foods first, then vegetables. If you have room add some fruit/carbs occassionally at meals  Avoid extra carbs at snacks if weight loss slows  Teaching Method Utilized:  Visual Auditory Hands on  Barriers to learning/adherence to lifestyle  change: none  Demonstrated degree of understanding via:  Teach Back   Monitoring/Evaluation:  Dietary intake, exercise, and body weight. Follow up prn.

## 2015-06-02 MED FILL — JARDIANCE 10 MG TABLET: 10 | 90 days supply | Qty: 90 | Fill #2

## 2015-06-21 DIAGNOSIS — L739 Follicular disorder, unspecified: Secondary | ICD-10-CM | POA: Diagnosis not present

## 2015-06-21 DIAGNOSIS — Z85828 Personal history of other malignant neoplasm of skin: Secondary | ICD-10-CM | POA: Diagnosis not present

## 2015-06-21 DIAGNOSIS — L814 Other melanin hyperpigmentation: Secondary | ICD-10-CM | POA: Diagnosis not present

## 2015-06-21 DIAGNOSIS — Z23 Encounter for immunization: Secondary | ICD-10-CM | POA: Diagnosis not present

## 2015-06-21 DIAGNOSIS — D485 Neoplasm of uncertain behavior of skin: Secondary | ICD-10-CM | POA: Diagnosis not present

## 2015-06-21 DIAGNOSIS — D225 Melanocytic nevi of trunk: Secondary | ICD-10-CM | POA: Diagnosis not present

## 2015-06-21 DIAGNOSIS — L570 Actinic keratosis: Secondary | ICD-10-CM | POA: Diagnosis not present

## 2015-06-21 DIAGNOSIS — L821 Other seborrheic keratosis: Secondary | ICD-10-CM | POA: Diagnosis not present

## 2015-06-21 DIAGNOSIS — D1801 Hemangioma of skin and subcutaneous tissue: Secondary | ICD-10-CM | POA: Diagnosis not present

## 2015-06-21 DIAGNOSIS — D235 Other benign neoplasm of skin of trunk: Secondary | ICD-10-CM | POA: Diagnosis not present

## 2015-07-06 MED FILL — TRESIBA FLEXTOUCH 100 UNITS: 100 | 45 days supply | Qty: 15 | Fill #1

## 2015-07-07 DIAGNOSIS — E113311 Type 2 diabetes mellitus with moderate nonproliferative diabetic retinopathy with macular edema, right eye: Secondary | ICD-10-CM | POA: Diagnosis not present

## 2015-07-26 ENCOUNTER — Other Ambulatory Visit: Payer: Self-pay

## 2015-07-26 VITALS — BP 118/82 | HR 78 | Resp 16 | Ht 72.0 in | Wt 204.8 lb

## 2015-07-26 DIAGNOSIS — Z794 Long term (current) use of insulin: Principal | ICD-10-CM

## 2015-07-26 DIAGNOSIS — E119 Type 2 diabetes mellitus without complications: Secondary | ICD-10-CM

## 2015-07-26 NOTE — Patient Instructions (Signed)
1. Plan to continue to follow post bariatric diet 2. Plan to check blood sugars 1-2 times a day- before meals and 2 hours after eating.   Goals are 80-130 before meals and less than 180 after meals. 3. Plan to walk daily and weight train 2 times a week  4. Plan to keep appointment with Dr. Buddy Duty 08/11/15 5. Plan telephone call from Link to Wellness on 09/16/15 to close case if applicable

## 2015-07-26 NOTE — Patient Outreach (Signed)
[facility]) Care Management   07/26/2015  Eric Juarez Jul 10, 1955 [id number]  Eric Juarez is an 60 y.o. male.   Member seen for follow up office visit for Link to Wellness program for self management of Type 2 diabetes  Subjective:  Member states that his wife is retiring next month and they plan to move to [facility].  States that they have a contract on a house there and they are trying to sell their current home.  States she does not plan to COBRA with the [facility] insurance due to cost and they will be looking for insurance in [place] in the exchanges.  States that when he saw Dr. Buddy Duty he put him on Tresiba daily.  States that his morning sugars have ranged from 119-150.  States he is walking daily and doing physical work at [employer].  States he is following his post bariatric diet with eating small frequent meals with protein.   Objective:   Review of Systems  All other systems reviewed and are negative.   Physical Exam Today's Vitals   07/26/15 1020  BP: 118/82  Pulse: 78  Resp: 16  Height: 1.829 m (6')  Weight: 204 lb 12.8 oz (92.897 kg)  SpO2: 96%  PainSc: 0-No pain   Encounter Medications:   Outpatient Encounter Prescriptions as of 07/26/2015  Medication Sig  . Calcium Citrate-Vitamin D 500-500 MG-UNIT CHEW Chew 1 each by mouth 3 (three) times daily.  . Cyanocobalamin (VITAMIN B-12) 1000 MCG SUBL Place 1 tablet under the tongue daily.  . empagliflozin (JARDIANCE) 10 MG TABS tablet Take 10 mg by mouth daily.  . Insulin Degludec (TRESIBA FLEXTOUCH) 100 UNIT/ML SOPN Inject 24 Units into the skin at bedtime.  . Pediatric Multiple Vit-C-FA (FLINSTONES GUMMIES OMEGA-3 DHA PO) Take 1 tablet by mouth 2 (two) times daily.   . enalapril (VASOTEC) 5 MG tablet Take 5 mg by mouth every evening. Reported on 07/26/2015  . insulin aspart (NOVOLOG) 100 UNIT/ML injection Inject 0-20 Units into the skin every 4 (four) hours. (Patient not taking: Reported on 07/26/2015)  . Omega-3 Fatty Acids  (OMEGA 3 PO) Take 2 capsules by mouth daily. Reported on 07/26/2015  . oxyCODONE (ROXICODONE) 5 MG/5ML solution Take 5-10 mLs (5-10 mg total) by mouth every 4 (four) hours as needed for moderate pain or severe pain. (Patient not taking: Reported on 07/06/2014)  . pantoprazole (PROTONIX) 40 MG tablet Take 1 tablet (40 mg total) by mouth daily. (Patient not taking: Reported on 09/28/2014)  . simvastatin (ZOCOR) 20 MG tablet Take 20 mg by mouth daily. Reported on 07/26/2015   No facility-administered encounter medications on file as of 07/26/2015.    Functional Status:   In your present state of health, do you have any difficulty performing the following activities: 07/26/2015 04/25/2015  Hearing? N N  Vision? N N  Difficulty concentrating or making decisions? N N  Walking or climbing stairs? N N  Dressing or bathing? N N  Doing errands, shopping? N N    Fall/Depression Screening:    PHQ 2/9 Scores 07/26/2015 05/10/2015 04/25/2015 01/03/2015 12/20/2014 09/28/2014 08/10/2014  PHQ - 2 Score 0 0 0 0 0 0 0    Assessment:   Member seen for follow up office visit for Link to Wellness program for self management of Type 2 diabetes. Member continues to not be at diabetes goal of hemoglobin A1C of 7 with last reading 7.7. Member now on Tresiba and is having improved morning CBGs He  is maintaining his weight loss. He reports exercising regularly and following post bariatric diet. Member is to see Dr. Buddy Duty on 08/11/15.  Member is moving and will no longer be on [employer] and will not be eligible for the Link to Wellness program.  Plan to close case after no longer eligible for the program.  Plan:  Plan to continue to follow post bariatric diet Plan to check blood sugars 1-2 times a day- before meals and 2 hours after eating.   Goals are 80-130 before meals and less than 180 after meals. Plan to walk daily and weight train 2 times a week  Plan to keep appointment with Dr. Buddy Duty 08/11/15 Plan telephone call from  [place] to Wellness on 09/16/15 to close case if applicable [facility] CM Care Plan Problem One        Most Recent Value   Care Plan Problem One  Elevated blood sugars related to dx of Type 2 DM as evidenced by Hemoglobin A1C of 8   Role Documenting the Problem One  Care Management [place] for Problem One  Active   THN Long Term Goal (31-90 days)  Member will see decrease of blood sugars as evidenced by decrease of hemoglobin A1C to 7 or below within the next 90 days   [place] Term Goal Start Date  07/26/15 [Continue last hemoglobin A1C 7.7 ]   Interventions for Problem One Long Term Goal  Reviewed CHO counting and importance of following post bariatric surgery diet, Reinforced importance of exercise for glycemic control, Instructed on use of long acting insulin  Instructed to keep his appointment with Dr. Buddy Duty on 08/11/15, Instructed to notify RNCM when he is moving and no longer on the [place] RN, [facility] Care Management Coordinator-Link to [place] Management [phone]

## 2015-07-27 MED FILL — CONTOUR NEXT STRIPS: 20 days supply | Qty: 100 | Fill #3

## 2015-07-27 MED FILL — UNIFINE PENTIPS 8MM 31G: 31G X 8 MM | 90 days supply | Qty: 100 | Fill #1

## 2015-07-28 MED FILL — MICROLET LANCETS: 30 days supply | Qty: 100 | Fill #0 | Status: TO

## 2015-08-02 DIAGNOSIS — Z794 Long term (current) use of insulin: Secondary | ICD-10-CM | POA: Diagnosis not present

## 2015-08-02 DIAGNOSIS — Z6827 Body mass index (BMI) 27.0-27.9, adult: Secondary | ICD-10-CM | POA: Diagnosis not present

## 2015-08-02 DIAGNOSIS — E1142 Type 2 diabetes mellitus with diabetic polyneuropathy: Secondary | ICD-10-CM | POA: Diagnosis not present

## 2015-08-02 DIAGNOSIS — E663 Overweight: Secondary | ICD-10-CM | POA: Diagnosis not present

## 2015-08-02 DIAGNOSIS — Z9884 Bariatric surgery status: Secondary | ICD-10-CM | POA: Diagnosis not present

## 2015-08-09 DIAGNOSIS — E113311 Type 2 diabetes mellitus with moderate nonproliferative diabetic retinopathy with macular edema, right eye: Secondary | ICD-10-CM | POA: Diagnosis not present

## 2015-08-22 DIAGNOSIS — H52203 Unspecified astigmatism, bilateral: Secondary | ICD-10-CM | POA: Diagnosis not present

## 2015-08-29 MED FILL — JARDIANCE 10 MG TABLET: 10 | 90 days supply | Qty: 90 | Fill #3 | Status: TO

## 2015-08-29 MED FILL — MICROLET LANCETS: 30 days supply | Qty: 100 | Fill #1 | Status: TO

## 2015-08-29 MED FILL — CONTOUR NEXT STRIPS: 20 days supply | Qty: 100 | Fill #4 | Status: TO

## 2015-08-30 MED FILL — TRESIBA FLEXTOUCH 100 UNITS: 100 | 90 days supply | Qty: 30 | Fill #0 | Status: TO

## 2015-09-16 ENCOUNTER — Other Ambulatory Visit: Payer: Self-pay

## 2015-09-16 NOTE — Patient Outreach (Signed)
[facility]) Care Management  09/16/2015  Eric Juarez 15-Jan-1956 [id number]   Telephone call to follow up with member and to close case if he has changed insurances. States that the movers are at his house today and they are going to [facility] this weekend.  States that their house has sold and they have bought a house in [place].  States that his wife dropped their [facility] insurance on July 1 and they signed up for [employer] to start July 1 so that they have medical coverage.   Instructed that his case will be closed as he no longer is eligible for the program as he has dis-enrolled from coverage.  Voiced understanding. Plan to close case as member has dis-enrolled from coverage. Peter Garter RN, [facility] Care Management Coordinator-Link to [place] Management [phone]

## 2016-01-10 ENCOUNTER — Encounter (HOSPITAL_COMMUNITY): Payer: Self-pay

## 2016-01-17 ENCOUNTER — Telehealth (HOSPITAL_COMMUNITY): Payer: Self-pay

## 2016-01-17 NOTE — Telephone Encounter (Signed)
This patient is overdue for recommended follow-up with a bariatric surgeon at [facility]. A letter was sent to the patient on 01/10/16 from both [facility] in attempt to reestablish post-op care. The letter included a patient survey which was returned to the [facility] Bariatric Dept today.  Patient declined an appointment advising he has moved to [place] and has not yet established care. Looking for new primary care in early 2018. Copy of the survey was shared with Mallory Shirk at [facility] so she may file in patients office chart.

## 2016-03-03 IMAGING — DX DG UGI W/ KUB
2 series · 2 of 2 positions shown · non-contrast
Comparison: None.

CLINICAL DATA: Morbid obesity, class 3. Pre-op evaluation for
bariatric surgery.

EXAM:
UPPER GI SERIES WITH KUB
TECHNIQUE: After obtaining a scout radiograph a routine upper GI series was
performed using thin barium.
FLUOROSCOPY TIME:  Fluoroscopy Time (in minutes and seconds): 1
minutes 42 seconds
Number of Acquired Images:  12

[abdomen kub (1 of 2)]
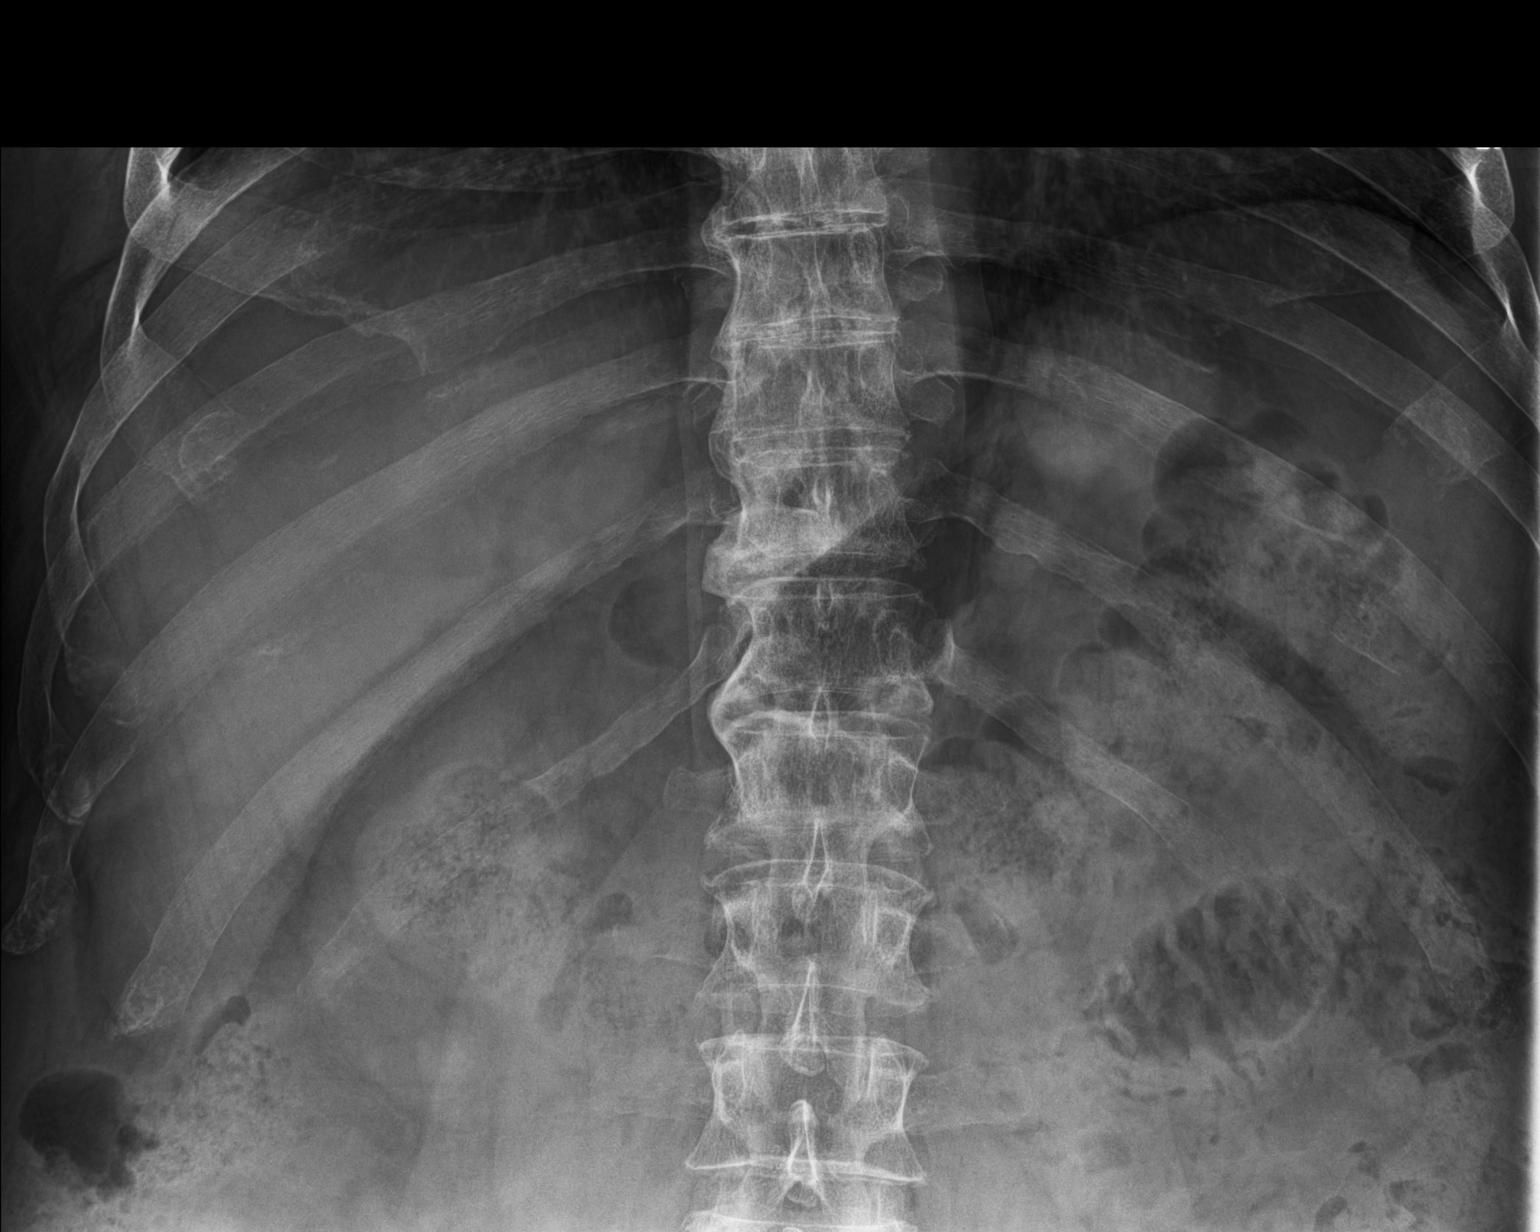

[abdomen kub (2 of 2)]
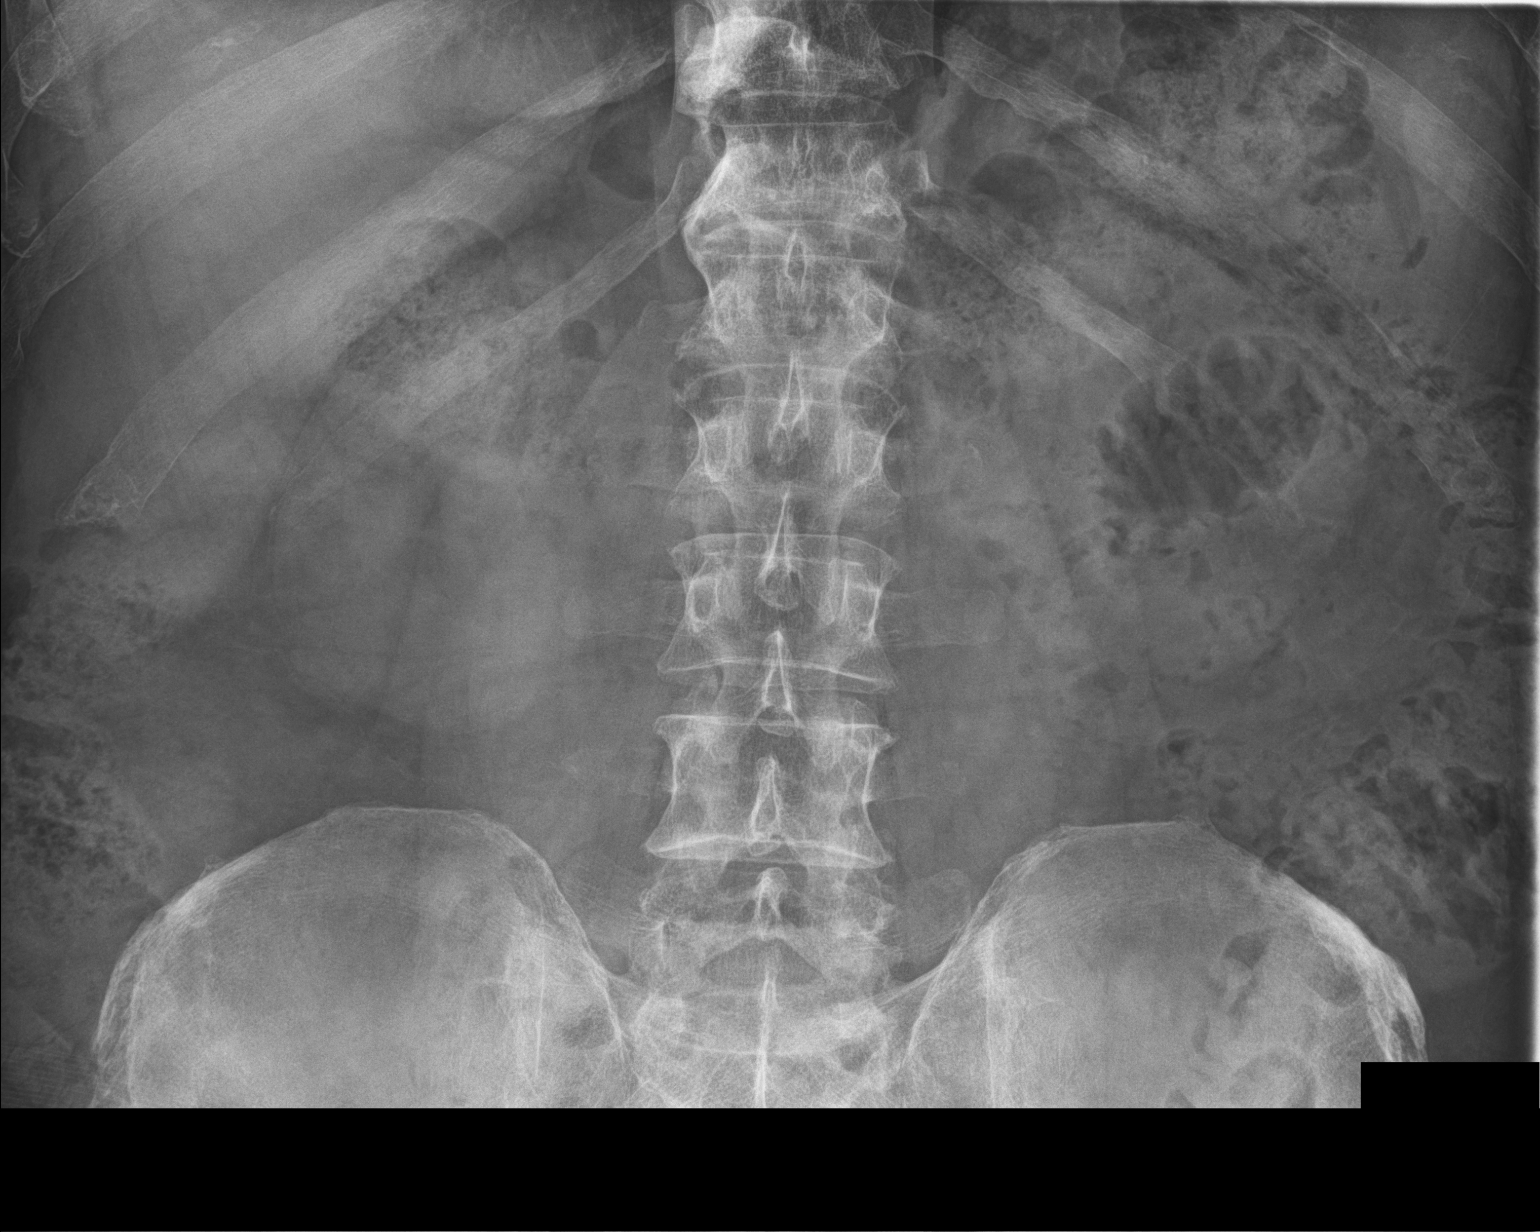

[2 of 2 positions shown; findings below may reference images not displayed]

FINDINGS: Scout radiograph:  Unremarkable bowel gas pattern.

Esophagus: No evidence of esophageal mass or stricture. Motility is
within normal limits. No gastroesophageal reflux observed.

Stomach: A tiny sliding hiatal hernia is visualized. No evidence of
gastric mass or ulcer.

Duodenum: No ulcer or other significant abnormality seen involving
duodenal bulb or sweep.

Other:  None.
IMPRESSION: Tiny sliding hiatal hernia. No evidence of esophageal stricture or
other significant abnormality.

## 2016-04-25 IMAGING — RF DG UGI W/ GASTROGRAFIN
14 of 24 series · 14 of 24 positions shown · IV contrast (omnipaque)
Comparison: 04/23/2014

CLINICAL DATA: Postop for roux-en-y gastrojejunostomy.

EXAM:
WATER SOLUBLE UPPER GI SERIES
TECHNIQUE: Single-column upper GI series was performed using water soluble
contrast.
CONTRAST:  50mL OMNIPAQUE IOHEXOL 300 MG/ML  SOLN

[Series 1: run · 1 of 1 slices shown (1 of 14)]
[im 1/1]
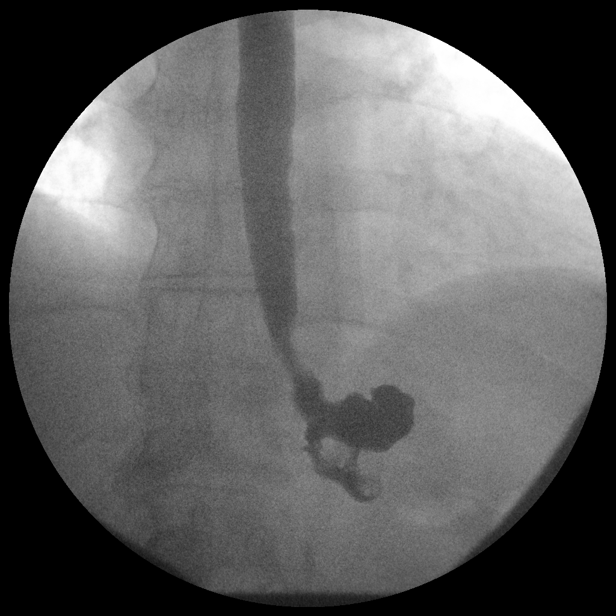

[Series 3: run · 1 of 1 slices shown (2 of 14)]
[im 1/1]
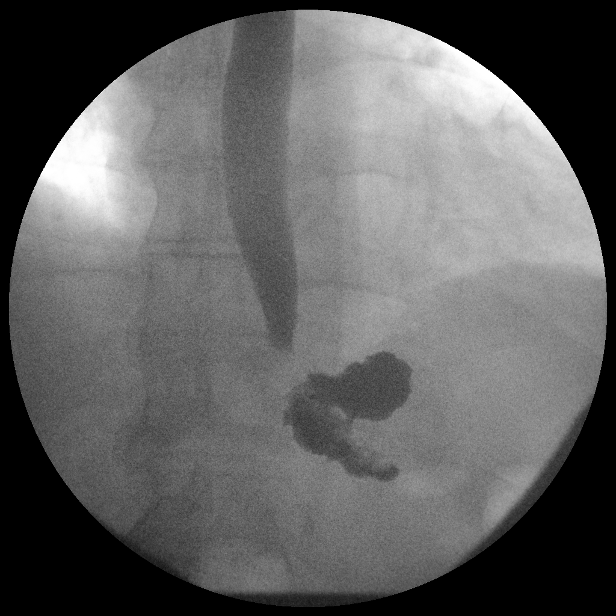

[Series 5: run · 1 of 1 slices shown (3 of 14)]
[im 1/1]
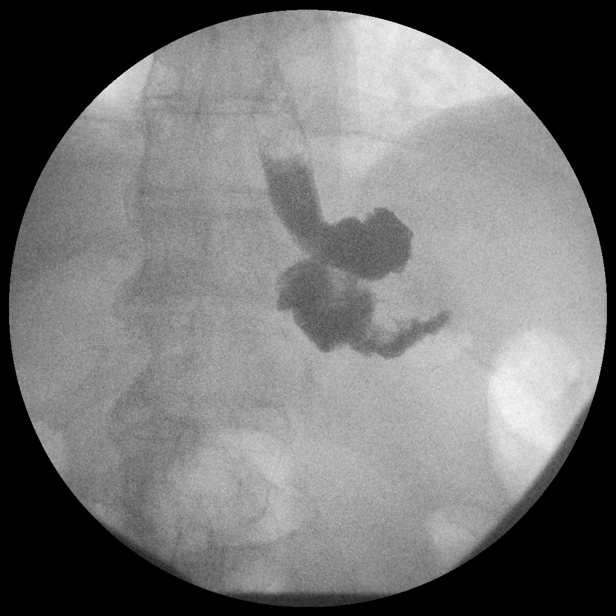

[Series 7: run · 1 of 1 slices shown (4 of 14)]
[im 1/1]
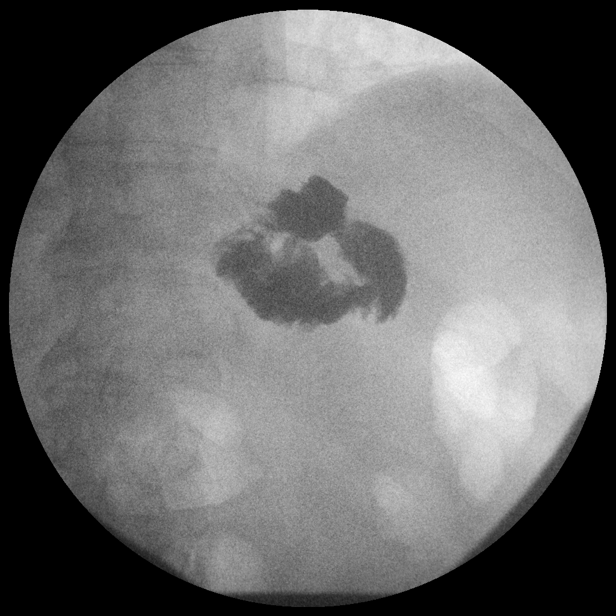

[Series 8: run · 1 of 1 slices shown (5 of 14)]
[im 1/1]
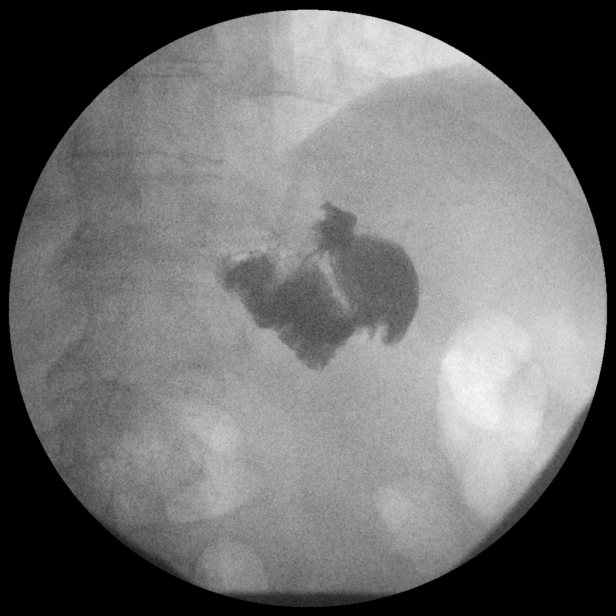

[Series 10: run · 1 of 1 slices shown (6 of 14)]
[im 1/1]
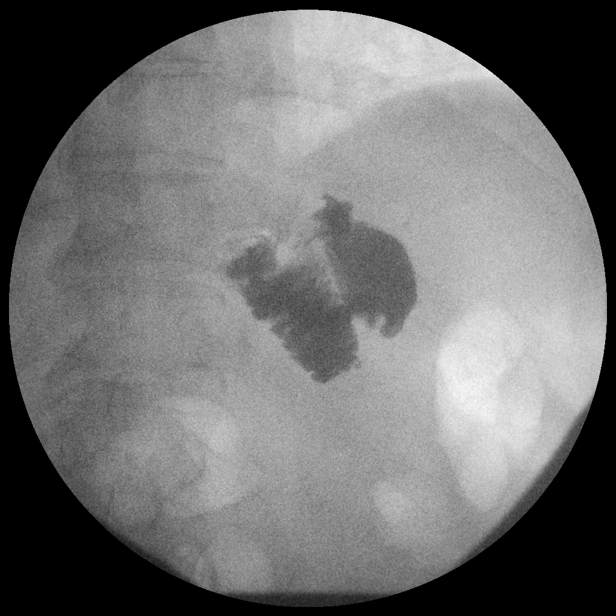

[Series 12: run · 1 of 1 slices shown (7 of 14)]
[im 1/1]
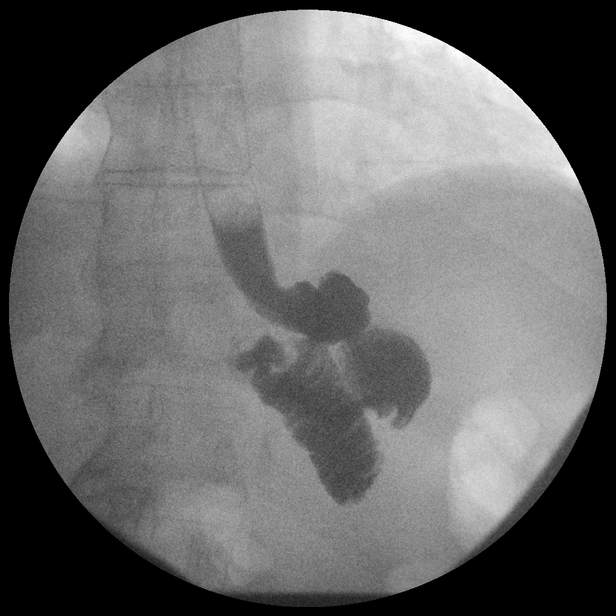

[Series 13: run · 1 of 1 slices shown (8 of 14)]
[im 1/1]
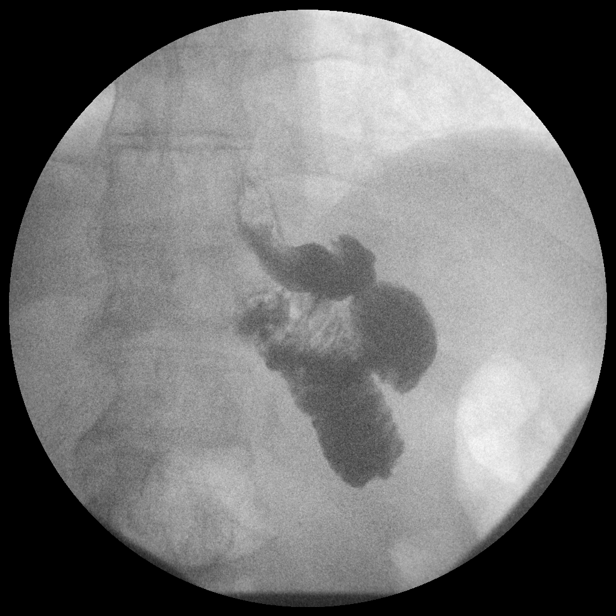

[Series 15: run · 1 of 1 slices shown (9 of 14)]
[im 1/1]
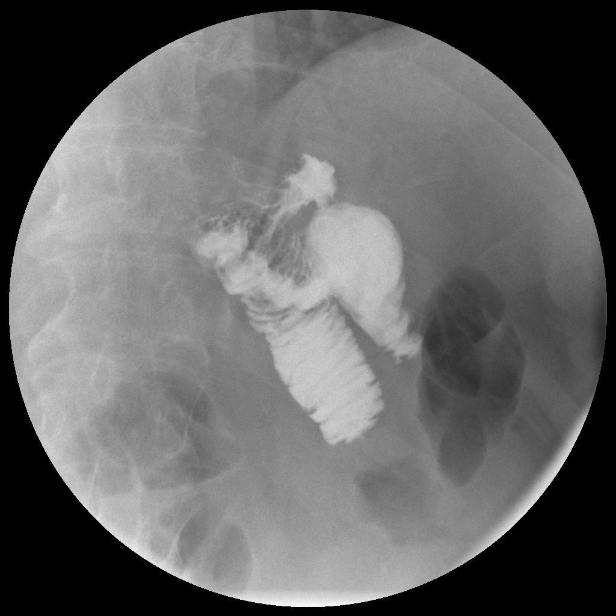

[Series 17: run · 1 of 1 slices shown (10 of 14)]
[im 1/1]
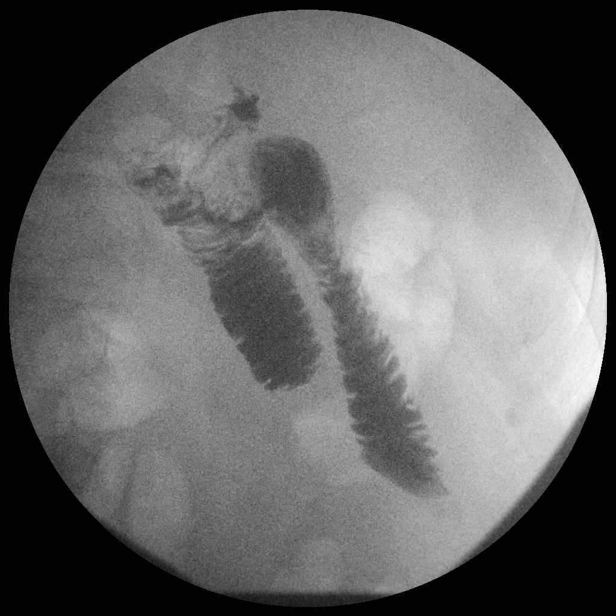

[Series 19: run · 1 of 1 slices shown (11 of 14)]
[im 1/1]
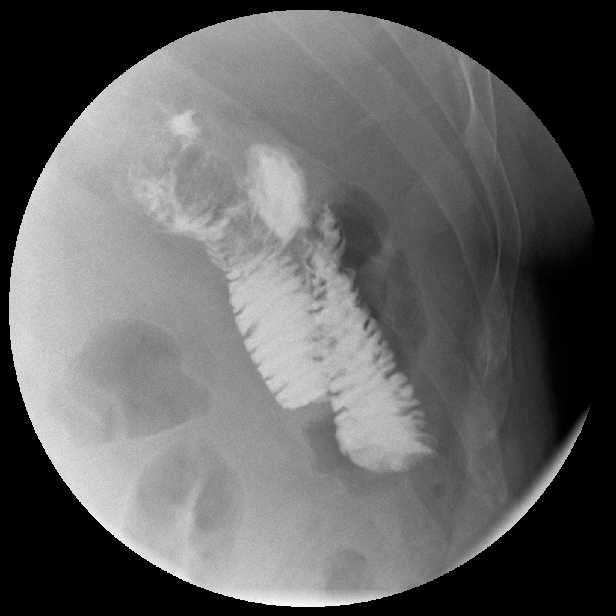

[Series 20: run · 1 of 1 slices shown (12 of 14)]
[im 1/1]
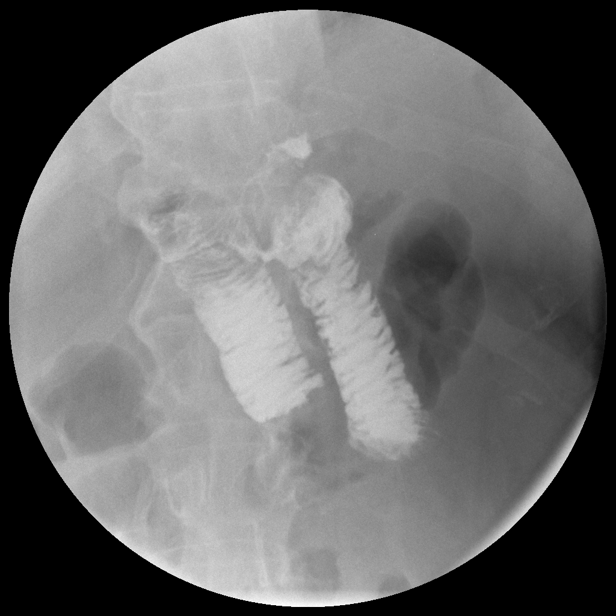

[Series 22: run · 1 of 1 slices shown (13 of 14)]
[im 1/1]
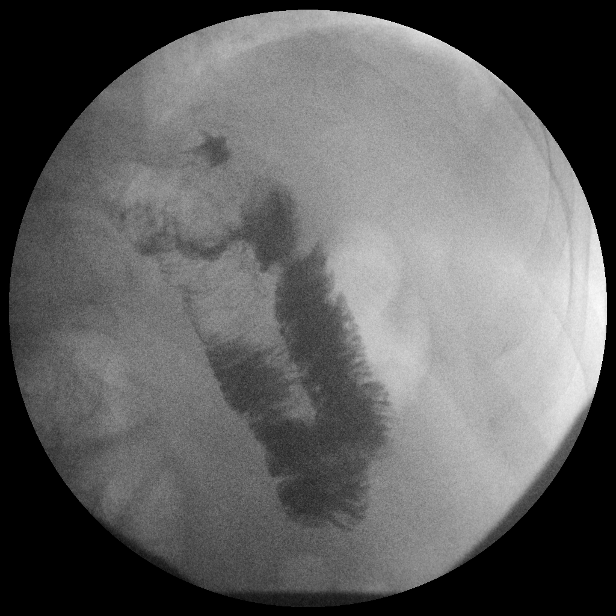

[Series 24: run · 1 of 1 slices shown (14 of 14)]
[im 1/1]
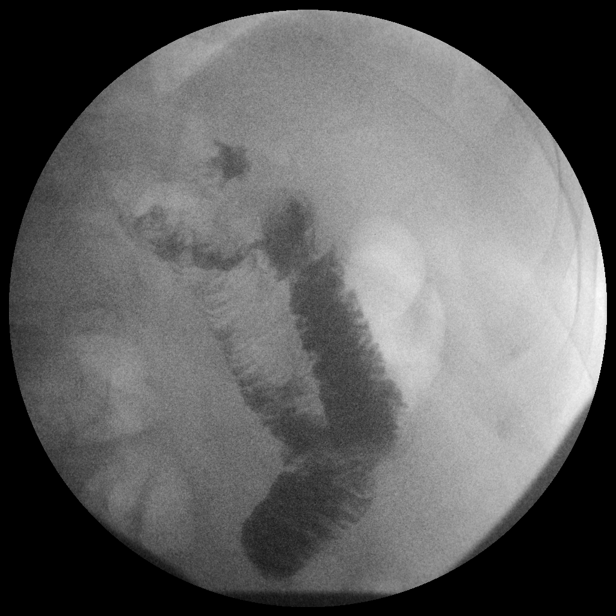

[14 of 24 positions shown; findings below may reference images not displayed]

FLUOROSCOPY TIME:  Radiation Exposure Index (as provided by the
fluoroscopic device):

If the device does not provide the exposure index:

Fluoroscopy Time (in minutes and seconds):  0 minutes, 51 seconds

Number of Acquired Images:  3
FINDINGS: Initial scout image of the upper abdomen appears normal.

Expected small gastric pouch observed. Patent gastrojejunostomy
without leak. Contrast medium flow as from the gastrojejunostomy
into a loop of jejunum, extending within the candy cane end and
within the efferent jejunum. No complicating feature observed.
IMPRESSION: 1. Patent gastrojejunostomy with expected postoperative appearance,
and no leak or complicating feature observed.
These results were called by telephone at the time of interpretation
acknowledged these results.

## 2017-01-14 ENCOUNTER — Encounter (HOSPITAL_COMMUNITY): Payer: Self-pay

## 2017-01-29 ENCOUNTER — Telehealth (HOSPITAL_COMMUNITY): Payer: Self-pay

## 2017-01-29 NOTE — Telephone Encounter (Signed)
Declined [facility]U appt This patient is overdue for recommended follow-up with a Ambulance person at [facility]. A letter was mailed to the address on file 11.12.18 from both [place] in attempt to reestablish post-op care. The letter included a patient survey which was returned to the [facility] Bariatric Dept today..  Patient declined an appointment again since he has moved out of town but added that since last contact from our program, he has now established local medical care with Sanjuan Dame, DO for his primary care needs. Comments added include, "Excellent results and outcome. RNY post-op going very well." Copy of the survey was shared with Bariatric [facility] as well as Mallory Shirk at [facility] so she may file in patients office chart & transfer records to Dr. Iline Oven office if needed.
